# Patient Record
Sex: Male | Born: 1990 | Race: White | Hispanic: No | Marital: Single | Smoking: Current every day smoker
Health system: Southern US, Community
[De-identification: ages and names within clinical notes are randomized; demographics above are authoritative.]

## PROBLEM LIST (undated history)

## (undated) DIAGNOSIS — F1910 Other psychoactive substance abuse, uncomplicated: Secondary | ICD-10-CM

## (undated) DIAGNOSIS — I10 Essential (primary) hypertension: Secondary | ICD-10-CM

## (undated) DIAGNOSIS — F1921 Other psychoactive substance dependence, in remission: Secondary | ICD-10-CM

## (undated) DIAGNOSIS — J45909 Unspecified asthma, uncomplicated: Secondary | ICD-10-CM

## (undated) HISTORY — DX: Other psychoactive substance dependence, in remission: F19.21

## (undated) HISTORY — PX: TONSILLECTOMY: SUR1361

## (undated) HISTORY — DX: Unspecified asthma, uncomplicated: J45.909

## (undated) HISTORY — PX: WISDOM TOOTH EXTRACTION: SHX21

---
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
## 2010-11-29 ENCOUNTER — Emergency Department: Payer: Self-pay | Admitting: Emergency Medicine

## 2011-11-04 ENCOUNTER — Ambulatory Visit: Payer: Self-pay | Admitting: Internal Medicine

## 2011-11-04 LAB — URINALYSIS, COMPLETE
Bilirubin,UR: NEGATIVE
Blood: NEGATIVE
Glucose,UR: NEGATIVE mg/dL (ref 0–75)
Leukocyte Esterase: NEGATIVE
Nitrite: NEGATIVE
Ph: 5 (ref 4.5–8.0)
Protein: NEGATIVE
Squamous Epithelial: NONE SEEN

## 2011-11-04 LAB — CBC WITH DIFFERENTIAL/PLATELET
Basophil #: 0 10*3/uL (ref 0.0–0.1)
Eosinophil #: 0.2 10*3/uL (ref 0.0–0.7)
HGB: 14.4 g/dL (ref 13.0–18.0)
Lymphocyte %: 26.9 %
MCH: 31.5 pg (ref 26.0–34.0)
MCHC: 33.9 g/dL (ref 32.0–36.0)
Neutrophil %: 61.7 %
Platelet: 131 10*3/uL — ABNORMAL LOW (ref 150–440)
RDW: 12.3 % (ref 11.5–14.5)

## 2011-11-04 LAB — COMPREHENSIVE METABOLIC PANEL
Albumin: 4.8 g/dL (ref 3.4–5.0)
Anion Gap: 10 (ref 7–16)
BUN: 9 mg/dL (ref 7–18)
Bilirubin,Total: 0.5 mg/dL (ref 0.2–1.0)
Calcium, Total: 9.4 mg/dL (ref 8.5–10.1)
EGFR (African American): >60
EGFR (Non-African Amer.): >60
Glucose: 77 mg/dL (ref 65–99)
SGOT(AST): 22 U/L (ref 15–37)
Sodium: 146 mmol/L — ABNORMAL HIGH (ref 136–145)
Total Protein: 7.6 g/dL (ref 6.4–8.2)

## 2011-11-04 LAB — ETHANOL: Ethanol %: 0.073 % (ref 0.000–0.080)

## 2011-11-04 LAB — DRUG SCREEN, URINE
Amphetamines, Ur Screen: NEGATIVE (ref ?–1000)
Barbiturates, Ur Screen: NEGATIVE (ref ?–200)
Benzodiazepine, Ur Scrn: POSITIVE (ref ?–200)
Cannabinoid 50 Ng, Ur ~~LOC~~: POSITIVE (ref ?–50)
Cocaine Metabolite,Ur ~~LOC~~: NEGATIVE (ref ?–300)
MDMA (Ecstasy)Ur Screen: NEGATIVE (ref ?–500)

## 2011-11-04 LAB — MAGNESIUM: Magnesium: 2.2 mg/dL

## 2012-10-28 ENCOUNTER — Emergency Department: Payer: Self-pay | Admitting: Internal Medicine

## 2012-10-28 LAB — URINALYSIS, COMPLETE
Bacteria: NONE SEEN
Bilirubin,UR: NEGATIVE
Protein: NEGATIVE
Squamous Epithelial: <1
WBC UR: 1 /HPF (ref 0–5)

## 2012-10-28 LAB — DRUG SCREEN, URINE
Barbiturates, Ur Screen: NEGATIVE (ref ?–200)
Cannabinoid 50 Ng, Ur ~~LOC~~: POSITIVE (ref ?–50)
MDMA (Ecstasy)Ur Screen: NEGATIVE (ref ?–500)
Opiate, Ur Screen: NEGATIVE (ref ?–300)

## 2012-10-28 LAB — CBC
HCT: 45.1 % (ref 40.0–52.0)
MCHC: 33 g/dL (ref 32.0–36.0)
MCV: 93 fL (ref 80–100)
Platelet: 176 10*3/uL (ref 150–440)
RBC: 4.85 10*6/uL (ref 4.40–5.90)
WBC: 8.4 10*3/uL (ref 3.8–10.6)

## 2012-10-28 LAB — COMPREHENSIVE METABOLIC PANEL
Alkaline Phosphatase: 63 U/L (ref 50–136)
BUN: 14 mg/dL (ref 7–18)
Bilirubin,Total: 0.8 mg/dL (ref 0.2–1.0)
Chloride: 104 mmol/L (ref 98–107)
Osmolality: 276 (ref 275–301)
SGPT (ALT): 73 U/L (ref 12–78)

## 2012-10-28 LAB — ETHANOL: Ethanol %: <0.003 % (ref 0.000–0.080)

## 2012-10-28 LAB — TSH: Thyroid Stimulating Horm: 1.08 u[IU]/mL

## 2013-01-30 HISTORY — PX: WISDOM TOOTH EXTRACTION: SHX21

## 2013-06-14 ENCOUNTER — Emergency Department
Admission: EM | Admit: 2013-06-14 | Discharge: 2013-06-14 | Disposition: A | Discharge status: Home / Self Care | Payer: BC Managed Care – PPO | Attending: Sports Medicine" | Admitting: Sports Medicine"

## 2013-06-14 ENCOUNTER — Emergency Department: Payer: Self-pay

## 2013-06-14 DIAGNOSIS — B353 Tinea pedis: Secondary | ICD-10-CM | POA: Insufficient documentation

## 2013-06-14 DIAGNOSIS — J45909 Unspecified asthma, uncomplicated: Secondary | ICD-10-CM | POA: Insufficient documentation

## 2013-06-14 MED ORDER — KETOCONAZOLE 2 % EX CREA
TOPICAL_CREAM | Freq: Two times a day (BID) | CUTANEOUS | Status: DC
Start: 2013-06-14 — End: 2013-11-15

## 2013-06-14 NOTE — ED Notes (Signed)
[facility]--has red,scaling right great toe, has had for 5 month--getting worse.  Now beginning to throb--and hurt to walk on.

## 2013-06-14 NOTE — ED Notes (Signed)
Pt discharged with rx and instructions

## 2013-06-14 NOTE — ED Provider Notes (Signed)
Physician/Midlevel provider first contact with patient: 06/14/13 1413         [facility] EMERGENCY DEPARTMENT History and Physical Exam      Date: 06/14/2013  Patient Name: Jeremiah Hurst,Jeremiah Hurst  Attending Physician: Gareth Morgan,*  Physician Extender: Merryl Hacker PA-C  PCP: Christa See, MD  Patient DOB:  [birth year]  MRN:  [id number]  Room:  E10/ED10-A  History of Presenting Illness     Chief complaint: Toe Pain    HPI/ROS is limited by: none  HPI/ROS given by: patient      Context: Jeremiah Hurst is a 22 y.o. male who presents with toe pain  Location: right great toe Severity: moderate  Duration: states over 5 months  Quality: aching, burning, itching  Associated Signs/ Symptoms: redness and scaling of skin  Exacerbation/Mitigating factors: Patient states he was incarcerated for a time prior to [facility] and now has worsening pain and redness of skin around great toe nail area.  No thickness of nail.      Review of Systems     Review of Systems   Constitutional: Negative.    Musculoskeletal: Positive for myalgias. Negative for joint pain and falls.   Skin: Positive for itching and rash.   Neurological: Negative for tingling, sensory change and focal weakness.   Endo/Heme/Allergies: Does not bruise/bleed easily.          Allergies     Pt  has no known allergies.    Medications     Current Outpatient Rx   Name  Route  Sig  Dispense  Refill   . IBUPROFEN 400 MG PO TABS    Oral    Take 400 mg by mouth every 6 (six) hours as needed.             Marland Kitchen KETOCONAZOLE 2 % EX CREA    Topical    Apply topically 2 (two) times daily.    30 g    0          Past Medical History     Pt has a past medical history of Asthma without status asthmaticus.    Past Surgical History     Pt has past surgical history that includes Wisdom tooth extraction.    Family History     The family history is not on file.    Social History     Pt reports that he has never smoked. He does not have any smokeless tobacco history on  file. He reports that he uses illicit drugs. He reports that he does not drink alcohol.    Physical Exam     CONSTITUTIONAL: Well-developed, well-nourished. Alert, cooperative and in   no acute distress. Vital signs reviewed.   HEAD: Normocephalic, atraumatic.  EYES:  Conjunctiva clear. No discharge or tearing.   RESPIRATORY: Good air movement bilaterally. No wheezing, rales or rhonchi.   No retractions or use of accessory muscles. No respiratory distress.   CARDIAC: RRR. Normal S1 and S2 without murmurs, rubs or gallops.   MS: +  Great toe tenderness. + erythema or STS.  Pulses 2+ and symmetric.    SKIN: Physical Exam   Skin: Rash noted. There is erythema.             No open wounds, no discharge or drainage noted from the area.         NEURO: A&O x 3. Sensory intact throughout.     Orders Placed     No orders of  the defined types were placed in this encounter.           ED Medications Administered     ED Medication Orders     None          Diagnostic Results       The results of the diagnostic studies below have been reviewed by myself:    Labs  Results     ** No Results found for the last 24 hours. **          Radiologic Studies  Radiology Results (24 Hour)     ** No Results found for the last 24 hours. **            MDM / Critical Care     Blood pressure 140/96, pulse 72, temperature 97.6 F (36.4 C), resp. rate 18, height 1.88 m, weight 83.915 kg, SpO2 100.00%.    MDM  Number of Diagnoses or Management Options  Tinea pedis: new, needed workup  Diagnosis management comments: Fungal infection, gout, cellulitis, trauma       Amount and/or Complexity of Data Reviewed  Discuss the patient with other providers: yes    Patient Progress  Patient progress: stable        Procedures         n/a    Diagnosis / Disposition     Clinical Impression  1. Tinea pedis        Disposition  ED Disposition     Discharge [name] discharge to home/self care.    Condition at disposition: Stable            Vital signs were  reviewed at the time of disposition.  Patient Vitals for the past 24 hrs:   BP Temp Pulse Resp SpO2 Height Weight   06/14/13 1355 140/96 mmHg 97.6 F (36.4 C) 72  18  100 % 1.88 m 83.915 kg          Prescriptions  New Prescriptions    KETOCONAZOLE (NIZORAL) 2 % CREAM    Apply topically 2 (two) times daily.                     Merryl Hacker [place], [place]  06/14/13 [phone]

## 2013-06-14 NOTE — Discharge Instructions (Signed)
Athlete'S Foot    Athlete's Foot is caused by a fungal infection in the skin. It affects the skin between the toes where it causes fissures (cracks in the skin). It can also affect the bottom of the foot where it causes dry white scales and peeling of the skin. This infection is more likely to occur when the foot is in hot, sweaty socks and shoes for long periods of time.  This infection is treated with skin creams or oral medicine.  Home Care:   It is important to keep the feet dry. Use absorbent cotton socks and change them if they become sweaty; or wear an open-toe shoe or sandal. Wash the feet at least once a day with soap and water.   Apply the antifungal cream as prescribed. Some antifungal creams are available without a prescription (Lotrimin, Tinactin).   It may take a week before the rash starts to improve and it can take about three to four weeks to completely clear. Continue the medicine until the rash is all gone.   Use over-the-counter antifungal powders or sprays on your feet after exposure to high-risk environments (public showers, gyms and locker rooms) may prevent future infections. You may wish to use appropriate footwear to reduce exposure.  Follow Up  with your doctor as recommended by our staff if the rash is not starting to improve after TEN days of treatment, or if the rash continues to spread.  Get Prompt Medical Attention  if any of the following occur:   Increasing redness or swelling of the foot   Pus draining from cracks in the skin   Fever of 100.4F (38C) or higher, or as directed by your healthcare provider   2000-2014 [employer], [address], [place] [address]. All rights reserved. This information is not intended as a substitute for professional medical care. Always follow your healthcare professional's instructions.

## 2013-06-15 NOTE — ED Provider Notes (Signed)
This patient presented to the Emergency Department today and was initially seen by Physician Assistant Merryl Hacker PA-C.  This patient was seen and examined by me and I agree with the management/treatment and plan.      Gareth Morgan, MD  06/15/13 [phone]

## 2013-11-15 ENCOUNTER — Emergency Department: Payer: BC Managed Care – PPO

## 2013-11-15 ENCOUNTER — Emergency Department
Admission: EM | Admit: 2013-11-15 | Discharge: 2013-11-15 | Disposition: A | Discharge status: Home / Self Care | Payer: BC Managed Care – PPO | Attending: Family Medicine | Admitting: Family Medicine

## 2013-11-15 ENCOUNTER — Emergency Department: Payer: Self-pay

## 2013-11-15 DIAGNOSIS — I880 Nonspecific mesenteric lymphadenitis: Secondary | ICD-10-CM | POA: Insufficient documentation

## 2013-11-15 LAB — COMPREHENSIVE METABOLIC PANEL
ALT: 27 U/L (ref 0–55)
AST (SGOT): 28 U/L (ref 10–42)
Albumin/Globulin Ratio: 1.55 Ratio — ABNORMAL HIGH (ref 0.70–1.50)
Albumin: 4.8 gm/dL (ref 3.5–5.0)
Alkaline Phosphatase: 58 U/L (ref 40–145)
Anion Gap: 17.9 mMol/L (ref 7.0–18.0)
BUN / Creatinine Ratio: 9.7 Ratio — ABNORMAL LOW (ref 10.0–30.0)
BUN: 9 mg/dL (ref 7–22)
Bilirubin, Total: 0.8 mg/dL (ref 0.1–1.2)
CO2: 23.9 mMol/L (ref 20.0–30.0)
Calcium: 10.3 mg/dL (ref 8.5–10.5)
Chloride: 104 mMol/L (ref 98–110)
Creatinine: 0.93 mg/dL (ref 0.80–1.30)
EGFR: >60 mL/min/{1.73_m2}
Globulin: 3.1 gm/dL (ref 2.0–4.0)
Glucose: 100 mg/dL — ABNORMAL HIGH (ref 70–99)
Osmolality Calc: 281 mOsm/kg (ref 275–300)
Potassium: 3.9 mMol/L (ref 3.5–5.3)
Protein, Total: 7.9 gm/dL (ref 6.0–8.3)
Sodium: 142 mMol/L (ref 136–147)

## 2013-11-15 LAB — CBC AND DIFFERENTIAL
Basophils %: 1 % (ref 0.0–3.0)
Basophils Absolute: 0.1 10*3/uL (ref 0.0–0.3)
Eosinophils %: 1.5 % (ref 0.0–7.0)
Eosinophils Absolute: 0.1 10*3/uL (ref 0.0–0.8)
Hematocrit: 48.3 % (ref 39.0–52.5)
Hemoglobin: 16.5 gm/dL (ref 13.0–17.5)
Lymphocytes Absolute: 2 10*3/uL (ref 0.6–5.1)
Lymphocytes: 27.9 % (ref 15.0–46.0)
MCH: 30 pg (ref 28–35)
MCHC: 34 gm/dL (ref 31–36)
MCV: 88 fL (ref 80–100)
MPV: 8.8 fL (ref 6.0–10.0)
Monocytes Absolute: 0.7 10*3/uL (ref 0.1–1.7)
Monocytes: 9.9 % (ref 3.0–15.0)
Neutrophils %: 59.7 % (ref 42.0–78.0)
Neutrophils Absolute: 4.2 10*3/uL (ref 1.7–8.6)
PLT CT: 173 10*3/uL (ref 130–440)
RBC: 5.47 10*6/uL (ref 4.00–5.70)
RDW: 11.5 % (ref 10.5–14.5)
WBC: 7.1 10*3/uL (ref 4.00–11.00)

## 2013-11-15 LAB — URINALYSIS WITH CULTURE REFLEX
Bilirubin, UA: NEGATIVE mg/dL
Blood, UA: NEGATIVE mg/dL
Glucose, UA: NEGATIVE mg/dL
Ketones UA: NEGATIVE mg/dL
Leukocyte Esterase, UA: NEGATIVE Leu/uL
Nitrite, UA: NEGATIVE
Protein, UR: NEGATIVE mg/dL
RBC, UA: NONE SEEN /hpf
Urine Specific Gravity: 1.01 (ref 1.001–1.040)
Urobilinogen, UA: 0.2 mg/dL
WBC, UA: NONE SEEN /hpf
pH, Urine: 8 pH (ref 5.0–8.0)

## 2013-11-15 LAB — LIPASE: Lipase: 12 U/L (ref 8–78)

## 2013-11-15 MED ORDER — IOHEXOL 240 MG/ML IJ SOLN
INTRAMUSCULAR | Status: AC
Start: 2013-11-15 — End: ?
  Filled 2013-11-15: qty 50

## 2013-11-15 MED ORDER — MORPHINE SULFATE (PF) 4 MG/ML IV SOLN
INTRAVENOUS | Status: AC
Start: 2013-11-15 — End: ?
  Filled 2013-11-15: qty 1

## 2013-11-15 MED ORDER — ONDANSETRON HCL 4 MG/2ML IJ SOLN
INTRAMUSCULAR | Status: AC
Start: 2013-11-15 — End: ?
  Filled 2013-11-15: qty 2

## 2013-11-15 MED ORDER — IOHEXOL 350 MG/ML IV SOLN
100.0000 mL | Freq: Once | INTRAVENOUS | Status: AC | PRN
Start: 2013-11-15 — End: 2013-11-15
  Administered 2013-11-15: 100 mL via INTRAVENOUS

## 2013-11-15 MED ORDER — MORPHINE SULFATE 4 MG/ML IJ/IV SOLN (WRAP)
4.0000 mg | Status: DC | PRN
Start: 2013-11-15 — End: 2013-11-15
  Administered 2013-11-15: 4 mg via INTRAVENOUS

## 2013-11-15 MED ORDER — ONDANSETRON HCL 4 MG/2ML IJ SOLN
4.0000 mg | Freq: Once | INTRAMUSCULAR | Status: DC
Start: 2013-11-15 — End: 2013-11-15

## 2013-11-15 MED ORDER — ONDANSETRON HCL 4 MG/2ML IJ SOLN
4.0000 mg | Freq: Once | INTRAMUSCULAR | Status: AC
Start: 2013-11-15 — End: 2013-11-15
  Administered 2013-11-15: 4 mg via INTRAVENOUS

## 2013-11-15 MED ORDER — KETOROLAC TROMETHAMINE 30 MG/ML IJ SOLN
INTRAMUSCULAR | Status: AC
Start: 2013-11-15 — End: ?
  Filled 2013-11-15: qty 1

## 2013-11-15 MED ORDER — KETOROLAC TROMETHAMINE 30 MG/ML IJ SOLN
30.0000 mg | Freq: Once | INTRAMUSCULAR | Status: AC
Start: 2013-11-15 — End: 2013-11-15
  Administered 2013-11-15: 30 mg via INTRAVENOUS

## 2013-11-15 MED ORDER — IOHEXOL 240 MG/ML IJ SOLN
50.0000 mL | Freq: Once | INTRAMUSCULAR | Status: AC
Start: 2013-11-15 — End: 2013-11-15
  Administered 2013-11-15: 50 mL via ORAL

## 2013-11-15 MED ORDER — SODIUM CHLORIDE 0.9 % IV SOLN
INTRAVENOUS | Status: DC
Start: 2013-11-15 — End: 2013-11-15

## 2013-11-15 MED ORDER — IBUPROFEN 600 MG PO TABS
600.0000 mg | ORAL_TABLET | Freq: Four times a day (QID) | ORAL | Status: DC | PRN
Start: 2013-11-15 — End: 2015-06-22

## 2013-11-15 NOTE — ED Notes (Signed)
Pt at ct scan at this time

## 2013-11-15 NOTE — ED Provider Notes (Signed)
Physician/Midlevel provider first contact with patient: 11/15/13 1610         EMERGENCY DEPARTMENT HISTORY AND PHYSICAL EXAM    Date: 11/15/2013  Patient Name: Jeremiah Hurst,Jeremiah Hurst  Attending Physician: Alger Simons, MD  Patient DOB:  1991/01/27  MRN:  [id number]  Room:  E6/ED6-A      History of Presenting Illness     Chief Complaint: RLQ abdominal pain    HPI/ROS is limited by: none  HPI/ROS given by: patient       Context: Jeremiah Hurst is a 23 y.o. male who presents with history of severe RLQ abdominal pain that began 2 days ago. No worse.  Location: RLQ abdominal pain Severity: severe  Duration: 2 days  Quality: aching   Associated Signs/ Symptoms: nausea Exacerbation/Mitigating factors: moving      PMD: Pcp, Noneorunknown, MD    Past Medical History     Past Medical History   Diagnosis Date   . Asthma without status asthmaticus        Past Surgical History     Past Surgical History   Procedure Date   . Wisdom tooth extraction    . Tonsillectomy    . Wisdom tooth extraction june 2014       Family History     Family History   Problem Relation Age of Onset   . Hypertension Father        Social History     History     Social History   . Marital Status: Single     Spouse Name: N/A     Number of Children: N/A   . Years of Education: N/A     Social History Main Topics   . Smoking status: Never Smoker    . Smokeless tobacco: Not on file   . Alcohol Use: No   . 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 Sexually Active: Not on file     Other Topics Concern   . Not on file     Social History Narrative   . No narrative on file       Allergies     No Known Allergies    Home Medications       (Not in a hospital admission)    ED Medications Administered     ED Medication Orders      Start     Status Ordering Provider    11/15/13 1031   ondansetron (ZOFRAN) injection 4 mg   Once in ED      Route: Intravenous  Ordered Dose: 4 mg         Last MAR action:  Given Gerldine Suleiman J II     11/15/13 1030   morphine injection 4 mg   Every 4 hours PRN      Route: Intravenous  Ordered Dose: 4 mg         Last MAR action:  Given Tashya Alberty J II    11/15/13 0935   ketorolac (TORADOL) injection 30 mg   Once in ED      Route: Intravenous  Ordered Dose: 30 mg         Last MAR action:  Given Yuliza Cara J II    11/15/13 0935   ondansetron (ZOFRAN) injection 4 mg   Once in ED      Route: Intravenous  Ordered Dose: 4 mg  Acknowledged Joycie Aerts J II    11/15/13 0934   iohexol (OMNIPAQUE) 240 MG/ML IV/ORAL solution 50 mL   Once in ED      Route: Oral  Ordered Dose: 50 mL         Last MAR action:  Given Minette Brine II    11/15/13 0934   0.9%  NaCl infusion   Continuous      Route: Intravenous         Last MAR action:  New Bag Lenzy Kerschner J II    11/15/13 0931   ondansetron (ZOFRAN) injection 4 mg   Once in ED      Route: Intravenous  Ordered Dose: 4 mg         Last MAR action:  Given Danelly Hassinger J II                  Review of Systems     CONSTITUTIONAL: No fever, chills, fatigue, weight loss or malaise.  EYES: No changes in vision. No eye pain or photophobia. No eye redness, itching, tearing or discharge.  ENMT: No hearing changes, otalgia, otorrhea, rhinorrhea, epistaxis, sore throat, voice changes, drooling, neck pain or stiffness.  CARDIAC: No chest pain, diaphoresis, palpitations, DOE, orthopnea or edema.  RESPIRATORY: No cough, wheezing, stridor or shortness of breath.  GI: + abdominal pain, + nausea, no vomiting, hematemesis, diarrhea, melena, hematochezia or changes in appetite.  GU: No dysuria, hematuria or changes in frequency. No urinary incontinence or retention. No discharge or bleeding. No changes in urinary output.   MS: No back pain, joint pain, joint swelling, joint redness or myalgias.   NEURO: No headache, dizziness, weakness, numbness or LOC. No lethargy, slurred speech or confusion. Normal balance.  INTEGUMENTARY: No skin rash, abrasions or breaks in skin.  HEMATOLOGIC: No  swollen lymph nodes, easy bruising or unexplained bleeding.  PSYCHIATRIC: No suicidal or homicidal thoughts. No auditory or visual   hallucinations.         Physical Exam   Vital signs and nurses notes reviewed.   CONSTITUTIONAL: Well-developed, well-nourished. Alert, cooperative and in   no acute distress. Vital signs reviewed.   HEAD: Normocephalic, atraumatic.  EYES: PERRL. EOM's intact. Conjunctiva clear. No discharge or tearing.   ENT: Normal nares without rhinorrhea. TM's clear bilaterally. Pharynx   visualized without erythema, tonsillar edema or exudates. Uvula midline.  NECK: Non tender. Full ROM without pain. No adenopathy. No JVD.   RESPIRATORY: Good air movement bilaterally. No wheezing, rales or rhonchi.   No retractions or use of accessory muscles. No respiratory distress.   CARDIAC: RRR. Normal S1 and S2 without murmurs, rubs or gallops.   GI: Soft yet tender RLQ. Non-distended, normal bowel sounds, no   organomegaly, + rebound. No CVA tenderness.   MS: No bony tenderness. Full ROM without pain. No edema, erythema or STS. No calf tenderness. Pulses 2+ and symmetric.    SKIN: Warm and dry. No rash or lesions. No abrasions or breaks in skin.  NEURO: A&O x 3. CN II-XII intact. 5/5 strength in bilateral upper and lower   extremities. Sensory intact throughout. Point to point and heel to shin performed without   Difficulty. Romberg and pronator drift negative. Gait steady and balanced.   PSYCH: Normal affect without agitation or paranoia. No apparent suicidal or   homicidal ideation.       Procedures     N/A    Diagnostic Study Results     EKG:  N/A    Monitor: N/A    Laboratory results reviewed by ED provider:  Results     Procedure Component Value Units Date/Time    Comprehensive metabolic panel [[id number]]  (Abnormal) Collected:11/15/13 0945    Specimen Information:Blood / Plasma Updated:11/15/13 1032     Sodium 142 mMol/L      Potassium 3.9 mMol/L      Chloride 104 mMol/L      CO2 23.9 mMol/L       CALCIUM 10.3 mg/dL      Glucose 295 (H) mg/dL      Creatinine 2.84 mg/dL      BUN 9 mg/dL      Protein, Total 7.9 gm/dL      Albumin 4.8 gm/dL      Alkaline Phosphatase 58 U/L      ALT 27 U/L      AST (SGOT) 28 U/L      Bilirubin, Total 0.8 mg/dL      Albumin/Globulin Ratio 1.55 (H) Ratio      Anion Gap 17.9 mMol/L      BUN/Creatinine Ratio 9.7 (L) Ratio      EGFR >60 mL/min/1.11m2      Osmolality Calc 281 mOsm/kg      Globulin 3.1 gm/dL     Lipase [[id number]] Collected:11/15/13 0945    Specimen Information:Blood / Plasma Updated:11/15/13 1032     Lipase 12 U/L     Urinalysis with Culture if Indicated [[id number]]  (Abnormal) Collected:11/15/13 0940    Specimen Information:Urine, Random Updated:11/15/13 1027     Color, UA Yellow      Clarity, UA Clear      Specific Gravity, UR 1.010      pH, Urine 8.0 pH      Protein, UR Negative mg/dL      Glucose, UA Negative mg/dL      Ketones UA Negative mg/dL      Bilirubin, UA Negative mg/dL      Blood, UA Negative mg/dL      Nitrite, UA Negative      Urobilinogen, UA 0.2 mg/dL      Leukocyte Esterase, UA Negative Leu/uL      WBC, UA None Seen /hpf      RBC, UA None Seen /hpf      Bacteria, UA None /hpf      Amorphous, UA Occasional (A) /uL     CBC and differential [[id number]] Collected:11/15/13 0945    Specimen Information:Blood / Blood Updated:11/15/13 1013     WBC 7.1 K/cmm      RBC 5.47 M/cmm      Hemoglobin 16.5 gm/dL      Hematocrit 38.7 %      MCV 88 fL      MCH 30 pg      MCHC 34 gm/dL      RDW 56.4 %      PLT CT 173 K/cmm      MPV 8.8 fL      NEUTROPHIL % 59.7 %      Lymphocytes 27.9 %      Monocytes 9.9 %      Eosinophils % 1.5 %      Basophils % 1.0 %      Neutrophils Absolute 4.2 K/cmm      Lymphocytes Absolute 2.0 K/cmm      Monocytes Absolute 0.7 K/cmm      Eosinophils Absolute 0.1 K/cmm      BASO Absolute 0.1 K/cmm  Radiologic study results reviewed by ED provider:  Radiology Results (24 Hour)     Procedure Component Value Units Date/Time    CT Abdomen  Pelvis W IV And PO Cont [[id number]] Collected:11/15/13 1213    Order Status:Completed  Updated:11/15/13 1222    Narrative:    History: 23 year old male with right lower quadrant abdominal pain.    Comparison: There are no priors currently available to allow for comparison.    Technique: Total axial images are taken from the level of the thyroid down through the proximal thigh with the use of IV  and oral contrast. Images are then reconstructed in the sagittal and coronal planes.    Contrast: Optiray 300 100cc      Findings:      CT abdomen:  The lung bases are within normal limits allowing for some minimal bibasilar atelectasis.   There is no evidence of free air.  The liver is infiltrated with fat..  The gallbladder is within normal limits for the amount of distention.  The spleen is within normal.  The adrenal glands are grossly unremarkable.  The pancreas is within normal limits.  The kidneys opacify with contrast appropriately.  There is no hydronephrosis nor obstructing stone.  The right ureter measures within normal.  The left ureter measures within normal.  The stomach is within normal limits for the amount of distention.  The small bowel measures within normal.  Small bowel mesentery contains a few subcentimeter lymph nodes.  The descending aorta measures within normal limits without evidence of injury.  There is no retroperitoneal lymphadenopathy.    CT pelvis:  The appendix measures within normal limits though seen on axial image 63-68 of series 3.  The large bowel is grossly unremarkable.  There is no free fluid in the pelvis.  The bladder is within normal limits for the amount distention.  The abdominal wall and pelvic wall are grossly unremarkable.  Osseous structures demonstrate degenerative change.           Impression:      1. Hepatic steatosis with hepatomegaly.  2. A few subcentimeter lymph nodes seen in the small bowel mesentery suggestive of mesenteric adenitis. Please correlate  with patient's  recent past medical history.        ReadingStation:[email or web address]      .    Rendering Provider: Alger Simons, MD      VS     Filed Vitals:    11/15/13 1109   BP: 147/85   Pulse: 79   Temp:    Resp: 16   SpO2: 100%         Clinical Course in Emergency Department     Consults: 12:44 Called Dr. Marlene Bast to discuss pt's case with him. Pt will follow up as directed.     Reevaluation: N/A    MDM: Appendicitis, Obstruction, Renal Lith,     Diagnosis and Disposition     Clinical Impression  1. Mesenteric adenitis        Disposition  ED Disposition     Discharge [name] discharge to home/self care.    Condition at disposition: Stable            Vital signs were reviewed at the time of disposition.  Patient Vitals for the past 24 hrs:   BP Temp Pulse Resp SpO2 Height Weight   11/15/13 1109 147/85 mmHg - 79  16  100 % - -   11/15/13 0917 134/92 mmHg 97.5 F (36.4 C)  81  18  100 % 1.905 m 86.183 kg          Prescriptions  New Prescriptions    IBUPROFEN (ADVIL,MOTRIN) 600 MG TABLET    Take 1 tablet (600 mg total) by mouth every 6 (six) hours as needed for Pain.           SIGNED BY: Minette Brine II, MD      This chart was generated by an EMR and may contain errors, including typographical, or omissions not intended by the user.                Alger Simons, MD  11/15/13 [phone]

## 2013-11-15 NOTE — ED Notes (Signed)
Iv started, blood drawn and sent to lab.

## 2013-11-15 NOTE — ED Notes (Signed)
Discharge reviewed with pt and friend.

## 2013-11-15 NOTE — ED Notes (Signed)
Pt up to br, urine sent to lab

## 2013-11-15 NOTE — ED Notes (Signed)
See quick triage

## 2013-11-15 NOTE — ED Notes (Signed)
Pt with c/o pain at level 8-10. States more pain than earlier. Discussed use of morphine with current rehab from narcs-pt with increase pain since here. Accepted morphine for pain control

## 2013-11-15 NOTE — ED Notes (Signed)
Pt completed po contrast. Radiology made aware.

## 2013-11-15 NOTE — Discharge Instructions (Signed)
Adenitis, Mesenteric   The mesentery is a sheet of tissue that attaches the intestines to the abdominal wall. Lymph nodes are small glands throughout the body. They are part of the system that fights infection. Mesenteric adenitis is swelling of the lymph nodes in the mesentery. The problem is caused by an infection, often of the intestines. Symptoms include severe pain in the abdomen, often on the lower right side. They may also include fever, throwing up, or diarrhea. The problem most often goes away on its own in a few days. If a bacterial infection is present, it may be treated with antibiotics. Medications may also be given to help relieve pain until the problem subsides.    Home Care  Medications: The doctor may prescribe medications for pain, nausea, or infection. Follow the doctor's instructions when using these medications. If you are given medication for infection, take all of it as directed until it is gone, even if you feel better.  General Care:    Rest until you feel better.   To help relieve abdominal pain, soak a towel in warm water and place it on your belly.   If you have had diarrhea or vomiting, follow the guidelines you are given for what to eat and drink and what to avoid.   Drink plenty of fluids.   Don't smoke or drink alcohol.  Follow Up  as advised by the doctor or our staff. It is often very hard to tell mesenteric adenitis apart from appendicitis. So close follow-up is needed.  Get Prompt Medical Attention  if any of the following occurs:   Fever of 100.4F (38C) or higher   Pain not relieved with medications, or pain that goes away and returns   Pain that is getting worse over time or changing in location   Severe diarrhea or vomiting   Severe headache   Few or no stools or gas   Little or no urine   Leg or foot cramps   Small dark red dots on the skin   Swelling in the abdomen   Bloody stools   2000-2014 [employer], [address], [place] [address].  All rights reserved. This information is not intended as a substitute for professional medical care. Always follow your healthcare professional's instructions.

## 2013-11-15 NOTE — ED Notes (Signed)
MD at bedside. 

## 2015-06-22 ENCOUNTER — Emergency Department: Payer: BC Managed Care – PPO

## 2015-06-22 ENCOUNTER — Emergency Department
Admission: EM | Admit: 2015-06-22 | Discharge: 2015-06-22 | Disposition: A | Discharge status: Home / Self Care | Payer: BC Managed Care – PPO | Attending: Emergency Medicine | Admitting: Emergency Medicine

## 2015-06-22 DIAGNOSIS — L03114 Cellulitis of left upper limb: Secondary | ICD-10-CM | POA: Insufficient documentation

## 2015-06-22 MED ORDER — CEPHALEXIN 500 MG PO CAPS
500.0000 mg | ORAL_CAPSULE | Freq: Three times a day (TID) | ORAL | Status: AC
Start: 2015-06-22 — End: 2015-07-02

## 2015-06-22 MED ORDER — TETANUS-DIPHTH-ACELL PERTUSSIS 5-2.5-18.5 LF-MCG/0.5 IM SUSP
INTRAMUSCULAR | Status: AC
Start: 2015-06-22 — End: ?
  Filled 2015-06-22: qty 0.5

## 2015-06-22 MED ORDER — TETANUS-DIPHTH-ACELL PERTUSSIS 5-2.5-18.5 LF-MCG/0.5 IM SUSP
0.5000 mL | INTRAMUSCULAR | Status: AC | PRN
Start: 2015-06-22 — End: 2015-06-22
  Administered 2015-06-22: 0.5 mL via INTRAMUSCULAR

## 2015-06-22 MED ORDER — SULFAMETHOXAZOLE-TRIMETHOPRIM 800-160 MG PO TABS
1.0000 | ORAL_TABLET | Freq: Two times a day (BID) | ORAL | Status: AC
Start: 2015-06-22 — End: 2015-07-02

## 2015-06-22 NOTE — ED Provider Notes (Signed)
[facility] EMERGENCY DEPARTMENT History and Physical Exam      Patient Name: Jeremiah Hurst, Jeremiah Hurst  Encounter Date:  06/22/2015  Attending Physician: Justice Britain, *  Nurse Practitioner: Angus Seller, NP  PCP: Christa See, MD  Patient DOB:  1990-10-19  MRN:  [id number]  Room:  E7/ED7-A      History of Presenting Illness     Chief complaint: Left elbow infection    HPI/ROS is limited by: none  HPI/ROS given by: patient    Location: left elbow  Duration: 2 days  Severity: moderate  Quality: 5/10  Timing: constant  Modifying Factors: none  Associated Symptoms: none      Jeremiah Hurst is a 24 y.o. male who presents with left elbow pain. He states a few days ago he had a "wart" near his elbow so he cut it off on his own with utility knife. He states it has not become red, hot, and painful to move. He went to urgent care, they gave him Rx for Bactrim and Keflex earlier, but told him he may need an x-ray and should go to the ER.       Review of Systems   Review of Systems   Constitutional: Negative for fever and chills.   Respiratory: Negative for cough.    Cardiovascular: Negative for chest pain.   Gastrointestinal: Negative for nausea, vomiting, abdominal pain and diarrhea.   Musculoskeletal: Positive for joint pain. Negative for falls.   Skin: Negative for rash.        Left elbow/forearm erythema and swelling   Neurological: Negative for dizziness.        Allergies     Pt has No Known Allergies.    Medications     Current Outpatient Rx   Name  Route  Sig  Dispense  Refill   . DiphenhydrAMINE HCl (BENADRYL ALLERGY PO)    Oral    Take 2 tablets by mouth as needed.             Marland Kitchen ibuprofen (ADVIL,MOTRIN) 400 MG tablet    Oral    Take 200 mg by mouth every 6 (six) hours as needed.                . cephALEXin (KEFLEX) 500 MG capsule    Oral    Take 1 capsule (500 mg total) by mouth 3 (three) times daily.    30 capsule    0     . sulfamethoxazole-trimethoprim (BACTRIM DS,SEPTRA DS) 800-160 MG per tablet    Oral     Take 1 tablet by mouth 2 (two) times daily.    20 tablet    0          Past Medical History     Pt has a past medical history of Asthma without status asthmaticus.    Past Surgical History     Pt has past surgical history that includes Wisdom tooth extraction; Tonsillectomy; and Wisdom tooth extraction (june 2014).    Family History     The family history includes Hypertension in his father.    Social History     Pt reports that he has been smoking Cigarettes.  He has been smoking about 0.50 packs per day. He does not have any smokeless tobacco history on file. He reports that he does not drink alcohol or use illicit drugs.    Physical Exam     Blood pressure 122/74, pulse 68, temperature 98 F (36.7 C), temperature source Oral,  resp. rate 16, height 1.88 m, weight 83.915 kg, SpO2 100 %.    Physical Exam   Constitutional: He appears well-developed and well-nourished.   HENT:   Head: Normocephalic.   Eyes: Pupils are equal, round, and reactive to light.   Cardiovascular: Normal rate.    Pulmonary/Chest: Effort normal.   Musculoskeletal:        Left elbow: He exhibits swelling. He exhibits normal range of motion, no effusion, no deformity and no laceration.        Arms:  Neurological: He is alert.   Skin: Skin is warm and dry.   Psychiatric: He has a normal mood and affect.   Nursing note and vitals reviewed.       Orders Placed     Orders Placed This Encounter   Procedures   . XR Elbow Left AP Lateral And Obliques       Diagnostic Results       The results of the diagnostic studies below have been reviewed by myself:    Labs  Results     ** No results found for the last 24 hours. **          Radiologic Studies  Radiology Results (24 Hour)     Procedure Component Value Units Date/Time    XR Elbow Left AP Lateral And Obliques [[id number]] Collected:  06/22/15 1420    Order Status:  Completed Updated:  06/22/15 1423    Narrative:      Clinical History:  Reason For Exam:  pain, cut off "lesion" on his  own    Examination:  AP, lateral and oblique views of the left elbow.    Comparison:  None available.    Findings:  There is no evidence of fracture or malalignment. The joint spaces appear maintained. There is no appreciable joint  effusion. Mild dorsal soft tissue swelling is suspected.      Impression:      Mild dorsal soft tissue swelling is suspected. No acute osseous pathology is seen.    ReadingStation:[id number]          EKG: none    Procedures     ED Course     Blood pressure 122/74, pulse 68, temperature 98 F (36.7 C), temperature source Oral, resp. rate 16, height 1.88 m, weight 83.915 kg, SpO2 100 %.    I reviewed the vital signs, nursing notes, past medical history, past surgical history, family history and social history.   I have reviewed the patient's previous charts.     O2 sat- saturation: 100 ; Oxygen use: RA Interpretation: Normal    Discussed the need to return to the ED in 48 hours if antibiotics have not improved area. He did not want anything for pain at this time.     Patient verbalized understanding and was agreeable to plan.     I discussed this case with the attending physician in the emergency department and they agree with the assessment and treatment plan.     Medications Given in ED:  E7-ED7-A - MAR ACTION REPORT  (last 24 hrs)         BERRY, Anshi Jalloh, RN       Medication Name Action Time Action Site Route Rate Dose Reason Comments User     tetanus-diphth-acell pertussis (BOOSTRIX) injection 0.5 mL 06/22/15 1448 Given Right Deltoid Intramuscular  0.5 mL   Audree Bane, RN                Prescriptions:  Discharge Medication List as of 06/22/2015  2:31 PM      START taking these medications    Details   cephALEXin (KEFLEX) 500 MG capsule Take 1 capsule (500 mg total) by mouth 3 (three) times daily., Starting 06/22/2015, Until Mon 07/02/15, Normal      sulfamethoxazole-trimethoprim (BACTRIM DS,SEPTRA DS) 800-160 MG per tablet Take 1 tablet by mouth 2 (two) times daily., Starting  06/22/2015, Until Mon 07/02/15, Normal               Follow-up Information     Follow up with [facility] Emergency Department In 2 days.    Specialty:  Emergency Medicine    Why:  If symptoms worsen, For wound re-check    Contact information:    [address]  [place] [address]  [phone]            MDM / Critical Care     This patient presents with a soft tissue infection that appears to be appropriate for outpatient treatment with close follow-up.  A serious, rapidly progressive infectious process, bone infection, abscess or necrotizing fasciitis is unlikely based upon the patient's presentation.  There is no extensive cellulitic area or lymphangitic spread or signs of sepsis.  Antibiotic treatment has been initiated here and response to treatment will be based on reassessment at close follow-up.  The patient has been instructed about signs and symptoms of acute progression of infection and to return immediately if any of these occur. Cellulitis and soft tissue infection warnings were given.  Patient was well-appearing on serial reevaluation at time of disposition.  Diagnostic impression and plan were discussed with the patient and/or family.  If performed results of lab/radiology tests were discussed with the patient and/or family. All questions were answered and concerns addressed.       Diagnosis / Disposition     Clinical Impression  1. Cellulitis of left upper extremity        Disposition  ED Disposition     Discharge [name] discharge to home/self care.    Condition at disposition: Stable            Angus Seller, NP  1:59 PM      This chart was generated by an EMR and may contain errors, including typographical, or omissions not intended by the user.         Angus Seller, NP  06/23/15 1116

## 2015-06-22 NOTE — ED Notes (Signed)
Pt informed this RN that his talked to his parents and was told that his tetanus shot was not up to date. This RN notified NP Calvert Cantor who gave verbal order for tetanus shot for pt.

## 2015-06-22 NOTE — ED Notes (Addendum)
Pt instructed to wait in room for 15 minutes after tetanus shot. Pt states pain has improved rates pain 6/10. Pt denies needs at this time. Discharge instructions, follow to the ED in 2 days and prescriptions reviewed with pt. Pt states understanding.

## 2015-06-22 NOTE — ED Notes (Signed)
Pt resting in bed quietly. Call bell in reach. Friend at bedside.

## 2015-06-22 NOTE — ED Notes (Signed)
Pt c/o redness and swelling ib left elbow that started Tuesday. Pt states that he cut off a wart on left elbow Monday. PT denies fever. Pt states pain is sharp and has some numbness in left hand.

## 2015-06-22 NOTE — Discharge Instructions (Signed)
Discharge Instructions for Cellulitis  You have been diagnosed with cellulitis. This is an infection in the deepest layer of the skin. In some cases, the infection also affects the muscle. Cellulitis is caused by bacteria. The bacteria canenter the body through broken skin. This can happen with a cut, scratch, animal bite, or an insect bite that has been scratched. You may have been treated in the hospital with antibiotics and fluids. You will likely be given a prescription for antibiotics to take at home. This sheet will help you take care of yourself at home.  Home Care  When you are home:   Take the prescribed antibiotic medication you are given as directed until it is gone. Take it even if you feel better. It treats the infection and stops it from returning. Not taking all of the medication can make future infections hard to treat.   Keep the infected area clean.   When possible, raise the infected area above the level of your heart. This helps keep swelling down.   Talk to your doctor if you are in pain. Ask what kind of over-the-counter medication you can take for pain.   Apply clean bandages as advised.   Take your temperature once a day for a week.   Wash your hands often to prevent spreading the infection.  In the future, wash your hands before and after you touch cuts, scratches, or bandages. This will help prevent infection.     [address] [employer], [place] [address]. All rights reserved. This information is not intended as a substitute for professional medical care. Always follow your healthcare professional's instructions.          Cellulitis  You have an infection of the skin known as cellulitis. This usually starts with a scrape, cut, insect bite, blister or other opening in the skin which becomes infected. This is a serious condition. It must be watched closely to be sure the infection is not spreading.  With antibiotic treatment, the size of the  red area will gradually shrink in size until the skin returns to normal. This will take 7-10 days.  The red area should never increase in size once the antibiotic medicine has been started. Occasionally, an infection will be resistant to one antibiotic and another one will have to be used.  Home Care:  1) Limit the use of the affected part, since excess movement can cause the infection to spread.  2) If the infection is on your leg, walk as little as possible during the first few days of the treatment. Keep your leg elevated while sitting. This will reduce swelling.  3) Take all of the antibiotic medicine exactly as directed until it is gone. Be careful not to miss any doses, especially during the first seven days.  Follow Up  with your doctor or this facility as directed. Check the infected area daily for the warning signs listed below.  Get Prompt Medical Attention  if any of the following occur:  -- Spreading area of redness  -- Increasing swelling or pain  -- Appearance of pus or drainage  -- Fever over 100.4 F (38.0 C) oral, or over 101.4 F (38.6 C) rectal, after two days on antibiotics   2000-2015 [employer] [address] [place] [address]. All rights reserved. This information is not intended as a substitute for professional medical care. Always follow your healthcare professional's instructions.  [facility] PHYSICIAN PRACTICES       Practice Name        Phone     Location       [facility]       [phone] [address] [place]  [address]         [facility]         [phone] [address]    [place], [place]  [address]           [facility]         [phone] [address],    [place], [place] [address]           [facility]       [phone] or Toll      Free: [phone]     [phone] [address]    [place], [place]   [address]       [facility]         [phone] [address],    [place], [place] [address]         [facility]       [phone] [address],    [place], [place] [address]         [facility]       [phone] S[address]    [place], [place]  [address]       [facility]       [phone] [address],    [place], [place] [address]         [facility]       [phone] [address],    [place], [place] [address]         [facility]       [phone] [address],    [place], [place] [address]         [facility]       [phone]     [address]    [place], [place]  [address]           [facility]       [phone]     [address]    [place], [place]  [address]          [facility] at       [facility]       [phone]     [address]          [address]    [place], [place]  [address]       [facility]       [phone]     [address]    [place], [place]  [address]         [facility]       [phone]     [address]    [address] [place], [place]  [address]

## 2015-06-22 NOTE — ED Notes (Signed)
No reaction noted. Pt ambulated out to lobby with steady gait.

## 2015-06-22 NOTE — ED Notes (Signed)
Tdap (Tetanus, Diphtheria, Pertussis) VIS  Format:  Select one   PDF [161 KB]   RTF [126 KB]  Recommend on [employer]   Facebook   LinkedIn   Email   Digg   Add this to your site     On this Page   Why get vaccinated?   Tdap vaccine   Some people should not get this vaccine   Risks   What if there is a serious problem?   The National Vaccine Injury Compensation Program   How can I learn more?  Current Edition Date: 10/25/2013   Print VIS[2 pages](http://www.cdc.gov/vaccines/hcp/vis/vis-statements/tdap.pdf)   Do you still see the "old" edition?(http://www.cdc.gov/vaccines/hcp/vis/refresh-browser.html)   RTF file[4 pages](http://www.cdc.gov/vaccines/hcp/vis/vis-statements/tdap.rtf)  (For use in electronic systems)   Provider Information: Tdap VIS(http://www.cdc.gov/vaccines/hcp/vis/vis-statements/tdap-hcp-info.html)   See note about this VIS(http://www.cdc.gov/vaccines/hcp/vis/what-is-new.html#td-tdap-mar)   VIS in other languages  (RoboDrop.co.nz)   Tdap Vaccine  What You Need to Know  Why get vaccinated?  Tetanus, diphtheria, and pertussis are very serious diseases. Tdap vaccine can protect [place] from these diseases. And, Tdap vaccine given to pregnant women can protect newborn babies against pertussis.  TETANUS (Lockjaw) is rare in the [place] today. It causes painful muscle tightening and stiffness, usually all over the body.   It can lead to tightening of muscles in the head and neck so you can't open your mouth, swallow, or sometimes even breathe. Tetanus kills about 1 out of 10 people who are infected even after receiving the best medical care.  DIPHTHERIA is also rare in the [place] today. It can cause a thick coating to form in the back of the throat.   It can lead to breathing problems, heart failure, paralysis, and death.  PERTUSSIS (Whooping Cough) causes severe coughing spells, which can cause difficulty  breathing, vomiting, and disturbed sleep.   It can also lead to weight loss, incontinence, and rib fractures. Up to 2 in 100 adolescents and 5 in 100 adults with pertussis are hospitalized or have complications, which could include pneumonia or death.  These diseases are caused by bacteria. Diphtheria and pertussis are spread from person to person through secretions from coughing or sneezing. Tetanus enters the body through cuts, scratches, or wounds.  Before vaccines, as many as 200,000 cases of diphtheria, 200,000 cases of pertussis, and hundreds of cases of tetanus, were reported in the [place] each year. Since vaccination began, reports of cases for tetanus and diphtheria have dropped by about 99% and for pertussis by about 80%.  Top of Page  Tdap vaccine  Tdap vaccine can protect adolescents and adults from tetanus, diphtheria, and pertussis. One dose of Tdap is routinely given at age 37 or 23. People who did not get Tdap at that age should get it as soon as possible.  Tdap is especially important for health care professionals and anyone having close contact with a baby younger than 12 months.  Pregnant women should get a dose of Tdap during every pregnancy, to protect the newborn from pertussis. Infants are most at risk for severe, life-threatening complications from pertussis.  Another vaccine, called Td, protects against tetanus and diphtheria, but not pertussis. A Td booster should be given every 10 years. Tdap may be given as one of these boosters if you have never gotten Tdap before. Tdap may also be given after a severe cut or burn to prevent tetanus infection.  Your doctor or the person giving you the vaccine can give you more information.  Tdap may safely be given at the same time as other vaccines.  Top of Page  Some people should not get this vaccine   A person who has ever had a life-threatening allergic reaction after a previous dose of any diphtheria, tetanus or pertussis containing  vaccine, OR has a severe allergy to any part of this vaccine, should not get Tdap vaccine. Tell the person giving the vaccine about any severe allergies.   Anyone who had coma or long repeated seizures within 7 days after a childhood dose of DTP or DTaP, or a previous dose of Tdap, should not get Tdap, unless a cause other than the vaccine was found. They can still get Td.   Talk to your doctor if you:   have seizures or another nervous system problem,   had severe pain or swelling after any vaccine containing diphtheria, tetanus or pertussis,   ever had a condition called Guillain Barr Syndrome (GBS),   aren't feeling well on the day the shot is scheduled.  Top of Page  Risks  With any medicine, including vaccines, there is a chance of side effects. These are usually mild and go away on their own. Serious reactions are also possible but are rare.  Most people who get Tdap vaccine do not have any problems with it.  Mild problems following Tdap:  (Did not interfere with activities)   Pain where the shot was given (about 3 in 4 adolescents or 2 in 3 adults)   Redness or swelling where the shot was given (about 1 person in 5)   Mild fever of at least 100.27F (up to about 1 in 25 adolescents or 1 in 100 adults)   Headache (about 3 or 4 people in 10)   Tiredness (about 1 person in 3 or 4)   Nausea, vomiting, diarrhea, stomach ache (up to 1 in 4 adolescents or 1 in 10 adults)   Chills,sore joints (about 1 person in 10)   Body aches (about 1 person in 3 or 4)   Rash, swollen glands (uncommon)  Moderate problems following Tdap:  (Interfered with activities, but did not require medical attention)   Pain where the shot was given (up to 1 in 5 or 6)   Redness or swelling where the shot was given (up to about 1 in 16 adolescents or 1 in 12 adults)   Fever over 102F (about 1 in 100 adolescents or 1 in 250 adults)   Headache (about 1 in 7 adolescents or 1 in 10 adults)   Nausea, vomiting, diarrhea, stomach  ache (up to 1 or 3 people in 100)   Swelling of the entire arm where the shot was given (up to about 1 in 500).  Severe problems following Tdap:  (Unable to perform usual activities; required medical attention)   Swelling, severe pain, bleeding, and redness in the arm where the shot was given (rare).  Problems that could happen after any vaccine:   People sometimes faint after a medical procedure, including vaccination. Sitting or lying down for about 15 minutes can help prevent fainting, and injuries caused by a fall. Tell your doctor if you feel dizzy, or have vision changes or ringing in the ears.   Some people get severe pain in the shoulder and have difficulty moving the arm where a shot was given. This happens very rarely.   Any medication can cause a severe allergic reaction. Such reactions from a vaccine are very rare, estimated at fewer than 1  in a million doses, and would happen within a few minutes to a few hours after the vaccination.  As with any medicine, there is a very remote chance of a vaccine causing a serious injury or death.  The safety of vaccines is always being monitored. For more information, visit the Vaccine Safety site.  Top of Page  What if there is a serious problem?  What should I look for?   Look for anything that concerns you, such as signs of a severe allergic reaction, very high fever, or unusual behavior.   Signs of a severe allergic reaction can include hives, swelling of the face and throat, difficulty breathing, a fast heartbeat, dizziness, and weakness. These would usually start a few minutes to a few hours after the vaccination.  What should I do?   If you think it is a severe allergic reaction or other emergency that can't wait, call 9-1-1 or get the person to the nearest hospital. Otherwise, call your doctor.   Afterward, the reaction should be reported to the Vaccine Adverse Event Reporting System (VAERS). Your doctor might file this report, or you can do it  yourself through the VAERS website, or by calling [phone].  VAERS does not give medical advice.  Top of Page  [employer] Vaccine Injury [employer]  The [employer] Vaccine Injury Compensation Program (VICP) is a federal program that was created to compensate people who may have been injured by certain vaccines.  Persons who believe they may have been injured by a vaccine can learn about the program and about filing a claim by calling [phone] or visiting the [facility] website. There is a time limit to file a claim for compensation.  Top of Page  How can I learn more?   Ask your doctor. He or she can give you the vaccine package insert or suggest other sources of information.   Call your local or state health department(http://www.cdc.gov/vaccines/imz-managers/awardee-imz-websites.html).   Contact the Centers for Disease Control and Prevention (CDC):   Call 864-198-3225 (1-800-CDC-INFO)   Visit CDC's vaccines website(http://www.cdc.gov/vaccines/default.htm)    Many Vaccine Information Statements are available in espaol and other languages. See PurchaseFilters.at.  Hojas de informacin sobre vacunas estn disponibles en espaol y en muchos otros idiomas. Visite http://www.hart-duffy.com/.asp  Vaccine Information Statement  Tdap Vaccine (10/25/2013)  [place]  981XB-14  Office Use Only

## 2017-06-19 DIAGNOSIS — G894 Chronic pain syndrome: Secondary | ICD-10-CM | POA: Insufficient documentation

## 2017-06-19 DIAGNOSIS — F1121 Opioid dependence, in remission: Secondary | ICD-10-CM | POA: Insufficient documentation

## 2019-01-11 ENCOUNTER — Other Ambulatory Visit: Payer: Self-pay

## 2019-01-11 ENCOUNTER — Ambulatory Visit (INDEPENDENT_AMBULATORY_CARE_PROVIDER_SITE_OTHER): Payer: Self-pay | Admitting: Family Medicine

## 2019-01-11 ENCOUNTER — Encounter: Payer: Self-pay | Admitting: Family Medicine

## 2019-01-11 VITALS — BP 120/70 | HR 72 | Ht 74.0 in | Wt 231.0 lb

## 2019-01-11 DIAGNOSIS — Z1211 Encounter for screening for malignant neoplasm of colon: Secondary | ICD-10-CM

## 2019-01-11 DIAGNOSIS — Z0000 Encounter for general adult medical examination without abnormal findings: Secondary | ICD-10-CM

## 2019-01-11 DIAGNOSIS — I10 Essential (primary) hypertension: Secondary | ICD-10-CM

## 2019-01-11 LAB — HEMOCCULT GUIAC POC 1CARD (OFFICE): Fecal Occult Blood, POC: NEGATIVE

## 2019-01-11 NOTE — Patient Instructions (Signed)

## 2019-01-11 NOTE — Progress Notes (Signed)
Date:  01/11/2019   Name:  Christian Doyle   DOB:  1991-05-03   MRN:  [id number]   Chief Complaint: Establish Care (no issues, no complaints)  Patient is a 28 year old male who presents for an establishment care exam. The patient reports the following problems: feet swelling. Health maintenance has been reviewed needs tetanus.   Review of Systems  Constitutional: Negative for chills and fever.  HENT: Negative for congestion, dental problem, drooling, ear discharge, ear pain and sore throat.   Eyes: Negative for photophobia, pain and redness.  Respiratory: Negative for cough, choking, chest tightness, shortness of breath, wheezing and stridor.   Cardiovascular: Negative for chest pain, palpitations and leg swelling.  Gastrointestinal: Negative for abdominal pain, blood in stool, constipation, diarrhea and nausea.  Endocrine: Negative for polydipsia.  Genitourinary: Negative for dysuria, frequency, hematuria and urgency.  Musculoskeletal: Negative for back pain, myalgias and neck pain.  Skin: Negative for rash.  Allergic/Immunologic: Negative for environmental allergies.  Neurological: Negative for dizziness, seizures and headaches.  Hematological: Does not bruise/bleed easily.  Psychiatric/Behavioral: Negative for suicidal ideas. The patient is not nervous/anxious.     Patient Active Problem List   Diagnosis Date Noted  . Chronic pain syndrome 06/19/2017  . Opioid dependence in remission (HCC) 06/19/2017    Not on File    Social History   Tobacco Use  . Smoking status: Current Every Day Smoker    Types: Cigarettes  . Smokeless tobacco: Former Engineer, waterUser  Substance Use Topics  . Alcohol use: Never    Frequency: Never  . Drug use: Yes    Types: Marijuana     Medication list has been reviewed and updated.  Current Meds  Medication Sig  . furosemide (LASIX) 20 MG tablet Take 1 tablet by mouth daily.  Marland Kitchen. lisinopril-hydrochlorothiazide (ZESTORETIC) 10-12.5 MG tablet Take  1 tablet by mouth daily.  . methadone (DOLOPHINE) 10 MG/ML solution 100 mg per day  . potassium chloride (K-DUR) 10 MEQ tablet Take 1 tablet by mouth daily.    No flowsheet data found.  BP Readings from Last 3 Encounters:  01/11/19 120/70    Physical Exam Vitals signs and nursing note reviewed.  Constitutional:      Appearance: Normal appearance. He is overweight.  HENT:     Head: Normocephalic. No left periorbital erythema.     Jaw: There is normal jaw occlusion.     Right Ear: Hearing, tympanic membrane, ear canal and external ear normal.     Left Ear: Hearing, tympanic membrane, ear canal and external ear normal.     Nose: Nose normal. No nasal deformity.     Right Nostril: No foreign body.     Left Nostril: No foreign body.     Mouth/Throat:     Lips: Pink.     Mouth: Mucous membranes are moist.     Dentition: Normal dentition.     Tongue: No lesions. Tongue does not deviate from midline.     Pharynx: Oropharynx is clear. Uvula midline.     Tonsils: No tonsillar exudate or tonsillar abscesses.  Eyes:     General: Lids are normal. Vision grossly intact. Gaze aligned appropriately. No scleral icterus.       Right eye: No discharge.        Left eye: No discharge.     Conjunctiva/sclera: Conjunctivae normal.     Pupils: Pupils are equal, round, and reactive to light.     Funduscopic exam:  Right eye: No hemorrhage, AV nicking, arteriolar narrowing or papilledema.        Left eye: No hemorrhage, AV nicking, arteriolar narrowing or papilledema.  Neck:     Musculoskeletal: Full passive range of motion without pain, normal range of motion and neck supple.     Thyroid: No thyroid mass, thyromegaly or thyroid tenderness.     Vascular: No carotid bruit, hepatojugular reflux or JVD.     Trachea: Trachea and phonation normal. No tracheal deviation.  Cardiovascular:     Rate and Rhythm: Normal rate and regular rhythm.     Chest Wall: PMI is not displaced. No thrill.     Pulses:  Normal pulses. No decreased pulses.          Dorsalis pedis pulses are 2+ on the right side and 2+ on the left side.       Posterior tibial pulses are 2+ on the right side and 2+ on the left side.     Heart sounds: Normal heart sounds, S1 normal and S2 normal. No murmur. No systolic murmur. No diastolic murmur. No friction rub. No gallop. No S3 or S4 sounds.   Pulmonary:     Effort: Pulmonary effort is normal. No respiratory distress.     Breath sounds: Normal breath sounds and air entry. No decreased breath sounds, wheezing, rhonchi or rales.  Chest:     Breasts: Breasts are symmetrical.        Right: Normal.        Left: Normal.  Abdominal:     General: Bowel sounds are normal. There is no distension.     Palpations: Abdomen is soft. There is no hepatomegaly, splenomegaly or mass.     Tenderness: There is no abdominal tenderness. There is no guarding or rebound.     Hernia: There is no hernia in the umbilical area or ventral area.  Genitourinary:    Penis: Normal.      Scrotum/Testes: Normal.     Epididymis:     Right: Normal.     Left: Normal.  Musculoskeletal: Normal range of motion.        General: No tenderness.     Right lower leg: 1+ Pitting Edema present.     Left lower leg: 1+ Pitting Edema present.  Feet:     Right foot:     Skin integrity: Skin integrity normal.     Left foot:     Skin integrity: Skin integrity normal.  Lymphadenopathy:     Head:     Right side of head: No submental, submandibular or tonsillar adenopathy.     Left side of head: No submental, submandibular or tonsillar adenopathy.     Cervical: No cervical adenopathy.     Right cervical: No superficial, deep or posterior cervical adenopathy.    Left cervical: No superficial, deep or posterior cervical adenopathy.  Skin:    General: Skin is warm.     Capillary Refill: Capillary refill takes less than 2 seconds.     Findings: No rash.  Neurological:     General: No focal deficit present.      Mental Status: He is alert and oriented to person, place, and time.     Cranial Nerves: Cranial nerves are intact. No cranial nerve deficit.     Sensory: Sensation is intact.     Motor: Motor function is intact.     Coordination: Coordination is intact.     Deep Tendon Reflexes: Reflexes are normal and symmetric.  Reflex Scores:      Tricep reflexes are 2+ on the right side and 2+ on the left side.      Bicep reflexes are 2+ on the right side and 2+ on the left side.      Brachioradialis reflexes are 2+ on the right side and 2+ on the left side.      Patellar reflexes are 2+ on the right side and 2+ on the left side.      Achilles reflexes are 2+ on the right side and 2+ on the left side. Psychiatric:        Behavior: Behavior is cooperative.     Wt Readings from Last 3 Encounters:  01/11/19 231 lb (104.8 kg)    BP 120/70   Pulse 72   Ht 6\' 2"  (1.88 m)   Wt 231 lb (104.8 kg)   BMI 29.66 kg/m   Assessment and Plan: 1. Annual physical exam No subjective/objective concerns noted reviewing history and during physical exam.  Review of previous exams, labs, x-rays, specialty visits unremarkable.  Will review labs when they are done at his other clinic visit. 2.  Essential hypertension- continue lisinopril hydrochlorothiazide as well as Lasix for occasional swelling although I think the edema to his feet is secondary to venous insufficiency 3.  Venous insufficiency patient has been instructed to elevate feet on a as needed basis and to avoid long sitting episodes such as sleeping in a chair position.

## 2019-01-11 NOTE — Addendum Note (Signed)
Addended by: Everitt Amber on: 01/11/2019 04:35 PM   Modules accepted: Orders

## 2019-09-26 ENCOUNTER — Ambulatory Visit
Admission: EM | Admit: 2019-09-26 | Discharge: 2019-09-26 | Disposition: A | Discharge status: Home / Self Care | Payer: HRSA Program | Attending: Family Medicine | Admitting: Family Medicine

## 2019-09-26 ENCOUNTER — Other Ambulatory Visit: Payer: Self-pay

## 2019-09-26 ENCOUNTER — Telehealth: Payer: Self-pay | Admitting: Family Medicine

## 2019-09-26 DIAGNOSIS — A084 Viral intestinal infection, unspecified: Secondary | ICD-10-CM

## 2019-09-26 DIAGNOSIS — I10 Essential (primary) hypertension: Secondary | ICD-10-CM | POA: Insufficient documentation

## 2019-09-26 DIAGNOSIS — F17210 Nicotine dependence, cigarettes, uncomplicated: Secondary | ICD-10-CM | POA: Insufficient documentation

## 2019-09-26 DIAGNOSIS — Z79899 Other long term (current) drug therapy: Secondary | ICD-10-CM | POA: Diagnosis not present

## 2019-09-26 DIAGNOSIS — Z20822 Contact with and (suspected) exposure to covid-19: Secondary | ICD-10-CM | POA: Diagnosis not present

## 2019-09-26 DIAGNOSIS — G894 Chronic pain syndrome: Secondary | ICD-10-CM | POA: Insufficient documentation

## 2019-09-26 HISTORY — DX: Essential (primary) hypertension: I10

## 2019-09-26 HISTORY — DX: Other psychoactive substance abuse, uncomplicated: F19.10

## 2019-09-26 MED ORDER — ONDANSETRON 8 MG PO TBDP: 8.0000 mg | ORAL_TABLET | Freq: Three times a day (TID) | ORAL | 0 refills | Status: DC | PRN

## 2019-09-26 NOTE — Telephone Encounter (Signed)
Pt woke up and took medication after a while he started throwing up and has been like that ever since, no other symptoms. Please advise

## 2019-09-26 NOTE — ED Provider Notes (Signed)
[facility]    CSN: [id number] Arrival date & time: 09/26/19  1110      History   Chief Complaint Chief Complaint  Patient presents with  . Emesis    HPI Christian Doyle is a 29 y.o. male.   29 yo male with a c/o vomiting that started this morning with chills. Denies any fevers, diarrhea, abdominal pains. No known sick contacts or suspicious foods. States he's vomited about 4 times.    Emesis   Past Medical History:  Diagnosis Date  . Drug abuse (HCC)   . Hypertension     Patient Active Problem List   Diagnosis Date Noted  . Chronic pain syndrome 06/19/2017  . Opioid dependence in remission (HCC) 06/19/2017    Past Surgical History:  Procedure Laterality Date  . TONSILLECTOMY         Home Medications    Prior to Admission medications   Medication Sig Start Date End Date Taking? Authorizing Provider  furosemide (LASIX) 20 MG tablet Take 1 tablet by mouth daily. 12/21/18  Yes [provider]  lisinopril-hydrochlorothiazide (ZESTORETIC) 10-12.5 MG tablet Take 1 tablet by mouth daily. 12/15/18  Yes [provider]  methadone (DOLOPHINE) 10 MG/ML solution 100 mg per day 06/19/17  Yes [provider]  potassium chloride (K-DUR) 10 MEQ tablet Take 1 tablet by mouth daily. 12/21/18  Yes [provider]  ondansetron (ZOFRAN ODT) 8 MG disintegrating tablet Take 1 tablet (8 mg total) by mouth every 8 (eight) hours as needed. 09/26/19   Payton Mccallum, MD    Family History Family History  Problem Relation Age of Onset  . Healthy Mother   . Healthy Father     Social History Social History   Tobacco Use  . Smoking status: Current Every Day Smoker    Types: Cigarettes  . Smokeless tobacco: Former Engineer, water Use Topics  . Alcohol use: Never  . Drug use: Yes    Types: Marijuana     Allergies   Patient has no known allergies.   Review of Systems Review of Systems  Gastrointestinal: Positive for  vomiting.     Physical Exam Triage Vital Signs ED Triage Vitals  Enc Vitals Group     BP 09/26/19 1142 126/74     Pulse Rate 09/26/19 1142 75     Resp --      Temp 09/26/19 1142 98.4 F (36.9 C)     Temp Source 09/26/19 1142 Oral     SpO2 09/26/19 1142 100 %     Weight 09/26/19 1139 225 lb (102.1 kg)     Height 09/26/19 1139 6' (1.829 m)     Head Circumference --      Peak Flow --      Pain Score 09/26/19 1139 0     Pain Loc --      Pain Edu? --      Excl. in GC? --    No data found.  Updated Vital Signs BP 126/74 (BP Location: Left Arm)   Pulse 75   Temp 98.4 F (36.9 C) (Oral)   Ht 6' (1.829 m)   Wt 102.1 kg   SpO2 100%   BMI 30.52 kg/m   Visual Acuity Right Eye Distance:   Left Eye Distance:   Bilateral Distance:    Right Eye Near:   Left Eye Near:    Bilateral Near:     Physical Exam Vitals and nursing note reviewed.  Constitutional:  General: He is not in acute distress.    Appearance: He is not toxic-appearing or diaphoretic.  Cardiovascular:     Rate and Rhythm: Normal rate.  Abdominal:     General: Bowel sounds are normal. There is no distension.     Palpations: Abdomen is soft. There is no mass.     Tenderness: There is no abdominal tenderness. There is no right CVA tenderness, left CVA tenderness, guarding or rebound.     Hernia: No hernia is present.  Neurological:     Mental Status: He is alert.      UC Treatments / Results  Labs (all labs ordered are listed, but only abnormal results are displayed) Labs Reviewed  NOVEL CORONAVIRUS, NAA (HOSP ORDER, SEND-OUT TO REF LAB; TAT 18-24 HRS)    EKG   Radiology No results found.  Procedures Procedures (including critical care time)  Medications Ordered in UC Medications - No data to display  Initial Impression / Assessment and Plan / UC Course  I have reviewed the triage vital signs and the nursing notes.  Pertinent labs & imaging results that were available during my care  of the patient were reviewed by me and considered in my medical decision making (see chart for details).      Final Clinical Impressions(s) / UC Diagnoses   Final diagnoses:  Viral gastroenteritis     Discharge Instructions     Rest, fluids (pedialyte, gatorade, water, etc)     ED Prescriptions    Medication Sig Dispense Auth. Provider   ondansetron (ZOFRAN ODT) 8 MG disintegrating tablet Take 1 tablet (8 mg total) by mouth every 8 (eight) hours as needed. 6 tablet Norval Gable, MD     1. diagnosis reviewed with patient 2. rx as per orders above; reviewed possible side effects, interactions, risks and benefits  3. Recommend supportive treatment as above 4. covid test done 5. Follow-up prn if symptoms worsen or don't improve   PDMP not reviewed this encounter.   Norval Gable, MD 09/26/19 1537

## 2019-09-26 NOTE — Discharge Instructions (Addendum)
Rest, fluids (pedialyte, gatorade, water, etc)

## 2019-09-26 NOTE — ED Triage Notes (Signed)
Pt presents with c/o emesis that started this morning along with hot/cold chills. He states he does take methadone and threw that up this morning. He states he feels very ill. He denies any known sick contacts. He reports his last meal was fresh fruit, soup and a chicken sandwich. He states he did start feeling unwell last night but began to feel much worse this morning and that is when he began to vomit uncontrollably. He states he feels very weak.

## 2019-09-27 LAB — NOVEL CORONAVIRUS, NAA (HOSP ORDER, SEND-OUT TO REF LAB; TAT 18-24 HRS): SARS-CoV-2, NAA: NOT DETECTED

## 2019-10-03 ENCOUNTER — Other Ambulatory Visit: Payer: Self-pay

## 2019-10-03 ENCOUNTER — Encounter: Payer: Self-pay | Admitting: Family Medicine

## 2019-10-03 ENCOUNTER — Ambulatory Visit (INDEPENDENT_AMBULATORY_CARE_PROVIDER_SITE_OTHER): Payer: Self-pay | Admitting: Family Medicine

## 2019-10-03 VITALS — BP 120/78 | HR 80 | Ht 72.0 in | Wt 233.0 lb

## 2019-10-03 DIAGNOSIS — R160 Hepatomegaly, not elsewhere classified: Secondary | ICD-10-CM

## 2019-10-03 DIAGNOSIS — I10 Essential (primary) hypertension: Secondary | ICD-10-CM

## 2019-10-03 DIAGNOSIS — Z7689 Persons encountering health services in other specified circumstances: Secondary | ICD-10-CM

## 2019-10-03 DIAGNOSIS — E669 Obesity, unspecified: Secondary | ICD-10-CM

## 2019-10-03 MED ORDER — LISINOPRIL-HYDROCHLOROTHIAZIDE 20-12.5 MG PO TABS: 1.0000 | ORAL_TABLET | Freq: Every day | ORAL | 1 refills | Status: DC

## 2019-10-03 NOTE — Patient Instructions (Signed)

## 2019-10-03 NOTE — Progress Notes (Signed)
Date:  10/03/2019   Name:  Christian Doyle   DOB:  [birth year]   MRN:  [id number]   Chief Complaint: Hypertension  Hypertension This is a chronic problem. The current episode started more than 1 year ago. The problem has been gradually improving since onset. The problem is controlled. Pertinent negatives include no anxiety, blurred vision, chest pain, headaches, malaise/fatigue, neck pain, orthopnea, palpitations, peripheral edema, PND, shortness of breath or sweats. There are no associated agents to hypertension. Risk factors for coronary artery disease include dyslipidemia. Past treatments include ACE inhibitors and diuretics. The current treatment provides moderate improvement. There are no compliance problems.  There is no history of angina, kidney disease, CAD/MI, CVA, heart failure, left ventricular hypertrophy, PVD or retinopathy. There is no history of chronic renal disease, a hypertension causing med or renovascular disease.    Lab Results  Component Value Date   CREATININE 0.89 10/28/2012   BUN 14 10/28/2012   NA 138 10/28/2012   K 4.0 10/28/2012   CL 104 10/28/2012   CO2 26 10/28/2012   No results found for: CHOL, HDL, LDLCALC, LDLDIRECT, TRIG, CHOLHDL Lab Results  Component Value Date   TSH 1.08 10/28/2012   No results found for: HGBA1C   Review of Systems  Constitutional: Negative for chills, fever and malaise/fatigue.  HENT: Negative for drooling, ear discharge, ear pain and sore throat.   Eyes: Negative for blurred vision.  Respiratory: Negative for cough, shortness of breath and wheezing.   Cardiovascular: Negative for chest pain, palpitations, orthopnea, leg swelling and PND.  Gastrointestinal: Negative for abdominal pain, blood in stool, constipation, diarrhea and nausea.  Endocrine: Negative for polydipsia.  Genitourinary: Negative for dysuria, frequency, hematuria and urgency.  Musculoskeletal: Negative for back pain, myalgias and neck pain.  Skin: Negative  for rash.  Allergic/Immunologic: Negative for environmental allergies.  Neurological: Negative for dizziness and headaches.  Hematological: Does not bruise/bleed easily.  Psychiatric/Behavioral: Negative for suicidal ideas. The patient is not nervous/anxious.     Patient Active Problem List   Diagnosis Date Noted  . Chronic pain syndrome 06/19/2017  . Opioid dependence in remission (HCC) 06/19/2017    No Known Allergies  Past Surgical History:  Procedure Laterality Date  . TONSILLECTOMY      Social History   Tobacco Use  . Smoking status: Current Every Day Smoker    Types: Cigarettes  . Smokeless tobacco: Former Engineer, water Use Topics  . Alcohol use: Never  . Drug use: Yes    Types: Marijuana     Medication list has been reviewed and updated.  Current Meds  Medication Sig  . lisinopril-hydrochlorothiazide (ZESTORETIC) 20-12.5 MG tablet Take 1 tablet by mouth daily.  . methadone (DOLOPHINE) 10 MG/ML solution [facility] 2/9 Scores 10/03/2019  PHQ - 2 Score 0  PHQ- 9 Score 0    BP Readings from Last 3 Encounters:  10/03/19 120/78  09/26/19 126/74  01/11/19 120/70    Physical Exam Vitals and nursing note reviewed.  HENT:     Head: Normocephalic.     Right Ear: Tympanic membrane, ear canal and external ear normal.     Left Ear: Tympanic membrane, ear canal and external ear normal.     Nose: Nose normal. No congestion or rhinorrhea.     Mouth/Throat:     Mouth: Mucous membranes are moist.  Eyes:     General: No scleral icterus.       Right eye: No  discharge.        Left eye: No discharge.     Conjunctiva/sclera: Conjunctivae normal.     Pupils: Pupils are equal, round, and reactive to light.  Neck:     Thyroid: No thyromegaly.     Vascular: No JVD.     Trachea: No tracheal deviation.  Cardiovascular:     Rate and Rhythm: Normal rate and regular rhythm.     Pulses:          Carotid pulses are 2+ on the right side and 2+ on the left  side.      Radial pulses are 2+ on the right side and 2+ on the left side.       Femoral pulses are 2+ on the right side and 2+ on the left side.      Popliteal pulses are 2+ on the right side and 2+ on the left side.       Dorsalis pedis pulses are 2+ on the right side and 2+ on the left side.       Posterior tibial pulses are 2+ on the right side and 2+ on the left side.     Heart sounds: Normal heart sounds, S1 normal and S2 normal. No murmur. No systolic murmur. No diastolic murmur. No friction rub. No gallop. No S3 or S4 sounds.   Pulmonary:     Effort: No respiratory distress.     Breath sounds: Normal breath sounds. No decreased breath sounds, wheezing, rhonchi or rales.  Abdominal:     General: Bowel sounds are normal.     Palpations: Abdomen is soft. There is hepatomegaly. There is no splenomegaly or mass.     Tenderness: There is no abdominal tenderness. There is no right CVA tenderness, left CVA tenderness, guarding or rebound.     Hernia: No hernia is present.  Musculoskeletal:        General: No tenderness. Normal range of motion.     Cervical back: Normal range of motion and neck supple. No rigidity.     Right lower leg: No edema.     Left lower leg: No edema.  Lymphadenopathy:     Head:     Right side of head: No submental or submandibular adenopathy.     Left side of head: No submental or submandibular adenopathy.     Cervical: No cervical adenopathy.     Right cervical: No superficial, deep or posterior cervical adenopathy.    Left cervical: No superficial, deep or posterior cervical adenopathy.     Upper Body:     Right upper body: No supraclavicular or axillary adenopathy.     Left upper body: No supraclavicular or axillary adenopathy.  Skin:    General: Skin is warm.     Findings: No rash.  Neurological:     Mental Status: He is alert and oriented to person, place, and time.     Cranial Nerves: No cranial nerve deficit.     Sensory: Sensation is intact.      Motor: Motor function is intact.     Deep Tendon Reflexes: Reflexes are normal and symmetric.     Wt Readings from Last 3 Encounters:  10/03/19 233 lb (105.7 kg)  09/26/19 225 lb (102.1 kg)  01/11/19 231 lb (104.8 kg)    BP 120/78   Pulse 80   Ht 6' (1.829 m)   Wt 233 lb (105.7 kg)   BMI 31.60 kg/m   Assessment and Plan:  1. Establishing  care with new doctor, encounter for Patient establishes with new physician today.  Patient was given information for anxiety to go to [place] [facility] for evaluation and treatment.  2. Hepatomegaly During the course of the encounter patient was noted to have a palpable liver edge that is smooth.  Patient admits that they had approached him about being evaluated for hepatitis C which he was unable to continue with.  We will check a lipid panel to make sure there is no familial hyperlipidemia as well as a hepatitis C antibody with reflex and a hepatitic function panel. - Lipid Panel With LDL/HDL Ratio - Hepatitis c antibody (reflex) - Hepatic Function Panel (6)  3. Essential hypertension Patient has history of hypertension which is controlled.  Will continue lisinopril hydrochlorothiazide 20-12.5 mg daily and will recheck in 6 months.  In the meantime we will get a renal function panel to evaluate GFR. - Renal Function Panel - lisinopril-hydrochlorothiazide (ZESTORETIC) 20-12.5 MG tablet; Take 1 tablet by mouth daily.  Dispense: 90 tablet; Refill: 1  4. Obesity (BMI 30.0-34.9) Health risks of being over weight were discussed and patient was counseled on weight loss options and exercise.  Dietary sheets were given to the patient from the [place] [facility].                                5.  Patient has been having pain going down his right leg.  I suggested to the patient this is likely a radicular nature of pain perhaps secondary to a disc and back.  If this is to continue we may need to refer and/or do further evaluation with x-rays patient is  to return if this continues.

## 2019-10-04 ENCOUNTER — Telehealth: Payer: Self-pay

## 2019-10-04 ENCOUNTER — Other Ambulatory Visit: Payer: Self-pay

## 2019-10-04 ENCOUNTER — Encounter: Payer: Self-pay | Admitting: Family Medicine

## 2019-10-04 DIAGNOSIS — R768 Other specified abnormal immunological findings in serum: Secondary | ICD-10-CM

## 2019-10-04 DIAGNOSIS — R7989 Other specified abnormal findings of blood chemistry: Secondary | ICD-10-CM

## 2019-10-04 LAB — COMMENT2 - HEP PANEL

## 2019-10-04 LAB — LIPID PANEL WITH LDL/HDL RATIO
Cholesterol, Total: 175 mg/dL (ref 100–199)
HDL: 63 mg/dL (ref >39)
LDL Chol Calc (NIH): 99 mg/dL (ref 0–99)
LDL/HDL Ratio: 1.6 ratio (ref 0.0–3.6)
Triglycerides: 71 mg/dL (ref 0–149)
VLDL Cholesterol Cal: 13 mg/dL (ref 5–40)

## 2019-10-04 LAB — HEPATIC FUNCTION PANEL (6)
ALT: 80 IU/L — ABNORMAL HIGH (ref 0–44)
AST: 53 IU/L — ABNORMAL HIGH (ref 0–40)
Alkaline Phosphatase: 98 IU/L (ref 39–117)
Bilirubin Total: 0.3 mg/dL (ref 0.0–1.2)
Bilirubin, Direct: 0.1 mg/dL (ref 0.00–0.40)

## 2019-10-04 LAB — RENAL FUNCTION PANEL
Albumin: 4.8 g/dL (ref 4.1–5.2)
BUN/Creatinine Ratio: 14 (ref 9–20)
BUN: 14 mg/dL (ref 6–20)
CO2: 23 mmol/L (ref 20–29)
Calcium: 10.1 mg/dL (ref 8.7–10.2)
Chloride: 101 mmol/L (ref 96–106)
Creatinine, Ser: 1.02 mg/dL (ref 0.76–1.27)
GFR calc Af Amer: 115 mL/min/{1.73_m2} (ref >59)
GFR calc non Af Amer: 100 mL/min/{1.73_m2} (ref >59)
Glucose: 96 mg/dL (ref 65–99)
Phosphorus: 3 mg/dL (ref 2.8–4.1)
Potassium: 4.4 mmol/L (ref 3.5–5.2)
Sodium: 140 mmol/L (ref 134–144)

## 2019-10-04 LAB — HEPATITIS C ANTIBODY (REFLEX): HCV Ab: >11 s/co ratio — ABNORMAL HIGH (ref 0.0–0.9)

## 2019-10-04 NOTE — Progress Notes (Unsigned)
Entered Inf disease referral

## 2019-10-04 NOTE — Telephone Encounter (Signed)
I notified pt of hep C results and elevated liver enzymes. We added Hepatitis Quantitative and HIV to labs- test numbers [id number] and [id number] added on Code number B19.20

## 2019-10-07 LAB — SPECIMEN STATUS REPORT

## 2019-10-07 LAB — HEPATITIS C GENOTYPE

## 2019-10-07 LAB — HCV RNA QUANT RFLX ULTRA OR GENOTYP
HCV Quant Baseline: 761000 IU/mL
HCV log10: 5.881 log10 IU/mL

## 2019-10-20 ENCOUNTER — Encounter: Payer: Self-pay | Admitting: Infectious Diseases

## 2019-10-27 ENCOUNTER — Other Ambulatory Visit: Payer: Self-pay

## 2019-10-27 ENCOUNTER — Ambulatory Visit: Payer: Self-pay | Attending: Infectious Diseases | Admitting: Infectious Diseases

## 2019-10-27 ENCOUNTER — Encounter: Payer: Self-pay | Admitting: Infectious Diseases

## 2019-10-27 VITALS — BP 117/74 | HR 84 | Resp 16 | Ht 72.0 in | Wt 236.0 lb

## 2019-10-27 DIAGNOSIS — I10 Essential (primary) hypertension: Secondary | ICD-10-CM

## 2019-10-27 DIAGNOSIS — F1990 Other psychoactive substance use, unspecified, uncomplicated: Secondary | ICD-10-CM

## 2019-10-27 DIAGNOSIS — F17210 Nicotine dependence, cigarettes, uncomplicated: Secondary | ICD-10-CM

## 2019-10-27 DIAGNOSIS — B182 Chronic viral hepatitis C: Secondary | ICD-10-CM

## 2019-10-27 NOTE — Progress Notes (Signed)
NAME: Christian Doyle  DOB: 1990-10-11  MRN: [id number]  Date/Time: 10/27/2019 9:18 AM  REQUESTING PROVIDER:Dr.Jones Subjective:  REASON FOR CONSULT: for Hepatitis C ? Christian Doyle is a 29 y.o. 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 Pt has been aware of HEPC since 2 years Is on a methadone program 120mg   He has been clean for 4 years He now has burning feet and fatigue and poor sleep. He saw Dr.Jones  PCP on 10/03/19 and she felt an enlarged liver and sent labs- CMP hepc and it came positive and he was referred to me  Past Medical History:  Diagnosis Date  . Drug abuse ([place])   . Drug addiction in remission ([place])   . Hypertension     Past Surgical History:  Procedure Laterality Date  . TONSILLECTOMY      Social History   Socioeconomic History  . Marital status: Single    Spouse name: Not on file  . Number of children: Not on file  . Years of education: Not on file  . Highest education level: Not on file  Occupational History  . Not on file  Tobacco Use  . Smoking status: Current Every Day Smoker    Packs/day: 0.50    Years: 12.00    Pack years: 6.00    Types: Cigarettes  . Smokeless tobacco: Former Network engineer and Sexual Activity  . Alcohol use: Never  . Drug use: Yes    Types: Marijuana, Heroin, Oxycodone    Comment: prescribed methadone  . Sexual activity: Yes  Other Topics Concern  . Not on file  Social History Narrative   Patient is on Methadone for substance abuse. [facility] [place], [place]. Patient Sober 4 years. Living with parents now.    Social Determinants of Health   Financial Resource Strain:   . Difficulty of Paying Living Expenses: Not on file  Food Insecurity:   . Worried About Charity fundraiser in the Last Year: Not on file  . Ran Out of Food in the Last Year: Not on file   Transportation Needs:   . Lack of Transportation (Medical): Not on file  . Lack of Transportation (Non-Medical): Not on file  Physical Activity:   . Days of Exercise per Week: Not on file  . Minutes of Exercise per Session: Not on file  Stress:   . Feeling of Stress : Not on file  Social Connections:   . Frequency of Communication with Friends and Family: Not on file  . Frequency of Social Gatherings with Friends and Family: Not on file  . Attends Religious Services: Not on file  . Active Member of Clubs or Organizations: Not on file  . Attends Archivist Meetings: Not on file  . Marital Status: Not on file  Intimate Partner Violence:   . Fear of Current or Ex-Partner: Not on file  . Emotionally Abused: Not on file  . Physically Abused: Not on file  . Sexually Abused: Not on file    Family History  Problem Relation Age of Onset  . Healthy Mother   . Healthy Father    No Known Allergies  ? Current Outpatient Medications  Medication Sig Dispense Refill  . lisinopril-hydrochlorothiazide (ZESTORETIC) 20-12.5 MG tablet Take 1 tablet by mouth daily.  90 tablet 1  . methadone (DOLOPHINE) 1 mg/ml oral solution Take 120 mg/kg by mouth daily.     No current facility-administered medications for this visit.     Abtx:  Anti-infectives (From admission, onward)   None      REVIEW OF SYSTEMS:  Const: negative fever, negative chills, negative weight loss Eyes: negative diplopia or visual changes, negative eye pain ENT: negative coryza, negative sore throat Resp: negative cough, hemoptysis, dyspnea Cards: negative for chest pain, palpitations, lower extremity edema GU: negative for frequency, dysuria and hematuria GI: Negative for abdominal pain, diarrhea, bleeding, constipation Skin: negative for rash and pruritus Heme: negative for easy bruising and gum/nose bleeding MS:  myalgias, arthralgias hands ,fatigue Neurolo:negative for headaches, dizziness, vertigo, memory  problems, burning feet   Psych: negative for feelings of anxiety, depression  Endocrine: negative for thyroid, diabetes Allergy/Immunology- negative for any medication or food allergies  Objective:  VITALS:  BP 117/74   Pulse 84   Resp 16   Ht 6' (1.829 m)   Wt 236 lb (107 kg)   SpO2 98%   BMI 32.01 kg/m  PHYSICAL EXAM:  General: Alert, cooperative, no distress, appears stated age.  Head: Normocephalic, without obvious abnormality, atraumatic. Eyes: Conjunctivae clear, anicteric sclerae. Pupils are equal ENT Nares normal. No drainage or sinus tenderness. Lips, mucosa, and tongue normal. No Thrush Neck: Supple, symmetrical, no adenopathy, thyroid: non tender no carotid bruit and no JVD. Back: No CVA tenderness. Lungs: Clear to auscultation bilaterally. No Wheezing or Rhonchi. No rales. Heart: Regular rate and rhythm, no murmur, rub or gallop. Abdomen: Soft, non-tender,not distended. Bowel sounds normal. No masses Extremities: mild puffiness hands and feet  Skin: No rashes or lesions. Or bruising Lymph: Cervical, supraclavicular normal. Neurologic: Grossly non-focal Pertinent Labs  Tests result DATE comment  HEPC RNA 761,000 10/03/19   HEPC  Genotype 1a 10/03/19   Hepatitis B profile     Hepatitis A status     PT/PTT     AST, ALT, Bilirubin 53/80/0.3 10/03/19   Albumin     creatinine     HB/Platelet     HIV     AFP     TSH     comorbidities      tobacco     alcohol     drug     APRI Score     FIB 4 score     Current meds     Ultrasound Elastography                ? Impression/Recommendation ? ?Chronic hepatitis C  without decompensation- he wants to be treated He has no insurance He needs a few labs and also imaging  Discussed with his mom and patient that I will check to see whether he can be treated at [facility] especially with me being away next month Contacted [facility]- pharmacist Cassie Kuppelweiser They will contact  him and get him there for treatment ?

## 2019-10-27 NOTE — Patient Instructions (Addendum)
You are here for hepatitis C management- You currently do not have insurance You will need lab work and ultrasound imaging before you can be treated. Will see whether [facility] or [facility] charity can help- will call you with the details Other option is [facility] charity care- [phone]

## 2019-10-31 ENCOUNTER — Telehealth: Payer: Self-pay | Admitting: Pharmacist

## 2019-10-31 NOTE — Telephone Encounter (Signed)
Patient is a new Hep C patient of Dr. Rivka Safer. She is out of town this month and wishes for him to be seen by [facility] for medication assistance (he is uninsured) and treatment follow up.  Called patient to schedule appointment with me but had to leave a HIPAA compliant VM.

## 2019-11-02 ENCOUNTER — Telehealth: Payer: Self-pay

## 2019-11-02 NOTE — Telephone Encounter (Signed)
Rcvd VM from patient mother asking about [facility] appt and charity care. I saw where Cassie from [facility] attempted to schedule appt but had to leave VM. I reached out to [facility] and was told he is set up at [facility] for Tuesday and this was set up Monday. I will explain to mom that patient is being taken care of and she can communicate with him if need to but we have his appt set up.

## 2019-11-08 ENCOUNTER — Telehealth: Payer: Self-pay | Admitting: Pharmacy Technician

## 2019-11-08 ENCOUNTER — Ambulatory Visit (INDEPENDENT_AMBULATORY_CARE_PROVIDER_SITE_OTHER): Payer: Self-pay | Admitting: Pharmacist

## 2019-11-08 ENCOUNTER — Other Ambulatory Visit: Payer: Self-pay

## 2019-11-08 DIAGNOSIS — B182 Chronic viral hepatitis C: Secondary | ICD-10-CM

## 2019-11-08 NOTE — Progress Notes (Signed)
HPI: Christian Doyle is a 29 y.o. male who presents to the [facility] pharmacy clinic for Hepatitis C follow-up.  Medication: TBD  Start Date: TBD  Hepatitis C Genotype: 1a  Fibrosis Score: TBD  Hepatitis C RNA: 761,000 on 10/03/19  Patient Active Problem List   Diagnosis Date Noted  . Essential hypertension 10/03/2019  . Chronic pain syndrome 06/19/2017  . Opioid dependence in remission (H[facility]) 06/19/2017    Patient's Medications  New Prescriptions   No medications on file  Previous Medications   LISINOPRIL-HYDROCHLOROTHIAZIDE (ZESTORETIC) 20-12.5 MG TABLET    Take 1 tablet by mouth daily.   METHADONE (DOLOPHINE) 1 MG/ML ORAL SOLUTION    Take 120 mg/kg by mouth daily.  Modified Medications   No medications on file  Discontinued Medications   No medications on file    Allergies: No Known Allergies  Past Medical History: Past Medical History:  Diagnosis Date  . Drug abuse ([facility])   . Drug addiction in remission ([facility])   . Hypertension     Social History: Social History   Socioeconomic History  . Marital status: Single    Spouse name: Not on file  . Number of children: Not on file  . Years of education: Not on file  . Highest education level: Not on file  Occupational History  . Not on file  Tobacco Use  . Smoking status: Current Every Day Smoker    Packs/day: 0.50    Years: 12.00    Pack years: 6.00    Types: Cigarettes  . Smokeless tobacco: Former Engineer, water and Sexual Activity  . Alcohol use: Never  . Drug use: Yes    Types: Marijuana, Heroin, Oxycodone    Comment: prescribed methadone  . Sexual activity: Yes  Other Topics Concern  . Not on file  Social History Narrative   Patient is on Methadone for substance abuse. [facility] [place], [place]. Patient Sober 4 years. Living with parents now.    Social Determinants of Health   Financial Resource Strain:   . Difficulty of Paying Living Expenses: Not on file  Food Insecurity:   . Worried  About Programme researcher, broadcasting/film/video in the Last Year: Not on file  . Ran Out of Food in the Last Year: Not on file  Transportation Needs:   . Lack of Transportation (Medical): Not on file  . Lack of Transportation (Non-Medical): Not on file  Physical Activity:   . Days of Exercise per Week: Not on file  . Minutes of Exercise per Session: Not on file  Stress:   . Feeling of Stress : Not on file  Social Connections:   . Frequency of Communication with Friends and Family: Not on file  . Frequency of Social Gatherings with Friends and Family: Not on file  . Attends Religious Services: Not on file  . Active Member of Clubs or Organizations: Not on file  . Attends Banker Meetings: Not on file  . Marital Status: Not on file    Labs: Hepatitis C Lab Results  Component Value Date   HCVGENOTYPE Comment 10/03/2019   Hepatitis B No results found for: HEPBSAB, HEPBSAG, HEPBCAB Hepatitis A No results found for: HAV HIV No results found for: HIV Lab Results  Component Value Date   CREATININE 1.02 10/03/2019   CREATININE 0.89 10/28/2012   CREATININE 1.02 11/04/2011   Lab Results  Component Value Date   AST 53 (H) 10/03/2019   AST 32 10/28/2012  AST 22 11/04/2011   ALT 80 (H) 10/03/2019   ALT 73 10/28/2012   ALT 28 11/04/2011   INR 1.0 11/08/2019    Assessment: Christian Doyle is here today to see me for Hepatitis C follow up.  He recently saw Dr. Delaine Lame at [facility] but was referred to me for financial assistance for labs and medication. Patient has genotype 1a Hep C with an initial Hep C RNA of 761,000.  Previous platelet function was slightly low in 2013 and 2014 so will recheck today.  Will also check a liver fibrosis panel to get an accurate F score.   I gave him a [facility] financial application and explained to him how to fill it out and mail to the financial aid office at [facility].  He also filled out a [facility] lab assistance form and it was given to the lab staff at the clinic to turn  in.  He also signed paperwork with Adonis Brook for [employer] Raeanne Gathers or [place]) and [employer]). Will determine medication once labs have returned.  Will call patient when a decision has been made.   Plan: - CBC, liver fibrosis panel, INR today - Recheck HIV at next visit - F/u once labs have returned  Arabela Basaldua L. Kalani Baray, PharmD, BCIDP, AAHIVP, CPP Clinical Pharmacist Practitioner Infectious Diseases [place] for Infectious Disease 11/09/2019, 2:25 PM

## 2019-11-08 NOTE — Patient Instructions (Signed)
[facility] [phone]

## 2019-11-08 NOTE — Telephone Encounter (Signed)
[facility] Patient Advocate Encounter    Findings of the benefits investigation:   Insurance: uninsured  Both applications for assistance were filled out and signed. Once new labs return we will submit to the patient assistance program. Patient will be listed household size 1 and income $1500/month. The address was confirmed for shipment.  Beulah Gandy, CPhT Specialty Pharmacy Patient [facility] for Infectious Disease Phone: [phone] Fax: [phone] 11/08/2019 9:38 AM

## 2019-11-11 LAB — CBC
HCT: 40.4 % (ref 38.5–50.0)
Hemoglobin: 13.4 g/dL (ref 13.2–17.1)
MCH: 30.9 pg (ref 27.0–33.0)
MCHC: 33.2 g/dL (ref 32.0–36.0)
MCV: 93.3 fL (ref 80.0–100.0)
MPV: 11.5 fL (ref 7.5–12.5)
Platelets: 176 10*3/uL (ref 140–400)
RBC: 4.33 10*6/uL (ref 4.20–5.80)
RDW: 12.3 % (ref 11.0–15.0)
WBC: 7.7 10*3/uL (ref 3.8–10.8)

## 2019-11-11 LAB — LIVER FIBROSIS, FIBROTEST-ACTITEST
ALT: 62 U/L — ABNORMAL HIGH (ref 9–46)
Alpha-2-Macroglobulin: 204 mg/dL (ref 106–279)
Apolipoprotein A1: 181 mg/dL — ABNORMAL HIGH (ref 94–176)
Bilirubin: 0.3 mg/dL (ref 0.2–1.2)
Fibrosis Score: 0.14
GGT: 137 U/L — ABNORMAL HIGH (ref 3–70)
Haptoglobin: 103 mg/dL (ref 43–212)
Necroinflammat ACT Score: 0.31
Reference ID: 3303440

## 2019-11-11 LAB — PROTIME-INR
INR: 1
Prothrombin Time: 10.2 s (ref 9.0–11.5)

## 2019-11-17 ENCOUNTER — Other Ambulatory Visit: Payer: Self-pay | Admitting: Pharmacist

## 2019-11-17 DIAGNOSIS — B182 Chronic viral hepatitis C: Secondary | ICD-10-CM

## 2019-11-17 MED ORDER — LEDIPASVIR-SOFOSBUVIR 90-400 MG PO TABS: 1.0000 | ORAL_TABLET | Freq: Every day | ORAL | 1 refills | Status: DC

## 2019-11-17 MED ORDER — MAVYRET 100-40 MG PO TABS: 3.0000 | ORAL_TABLET | Freq: Every day | ORAL | 1 refills | Status: DC

## 2019-11-17 NOTE — Progress Notes (Addendum)
Sending Mavyret x 8 weeks for patient's Hepatitis C infection.

## 2019-11-17 NOTE — Addendum Note (Signed)
Addended by: Robinette Haines on: 11/17/2019 04:48 PM   Modules accepted: Orders

## 2019-11-18 NOTE — Telephone Encounter (Addendum)
[facility] Patient Advocate Encounter  Application for Mavyret was signed and faxed today to [facility] Assist for determination. Will update the patient once we hear determination.  Patient was approved   Spoke to the patient's mother this morning and provided her the number to call [employer] and set up first shipment. [phone] Doing this will speed up the process or they can wait for the company to call. She was also sent the form for [facility] patient assistance. If there are any further questions, she knows to call us back directly.

## 2019-11-24 NOTE — Telephone Encounter (Signed)
He filled out the [facility] form when he saw me earlier this month. Thanks Lorene Dy!

## 2019-12-09 ENCOUNTER — Telehealth: Payer: Self-pay | Admitting: *Deleted

## 2019-12-09 NOTE — Telephone Encounter (Signed)
Patient received Mavyret, will start 12/10/19 Andree Coss, RN

## 2019-12-12 ENCOUNTER — Encounter: Payer: Self-pay | Admitting: Pharmacy Technician

## 2019-12-12 NOTE — Telephone Encounter (Signed)
Excellent! He was on the list to check and see if the medication arrived. Thank you, I will get in touch with the patient for scheduling the follow up visit.

## 2019-12-13 ENCOUNTER — Telehealth: Payer: Self-pay

## 2019-12-13 NOTE — Telephone Encounter (Signed)
I spoke with Zack today about his new HCV medication Mavyret. He already started this medication on 12/10/2019. He has no complaints so far, and he describes taking Mavyret appropriately - 3 tablets at the same time daily with food. He will continue this regimen for a total of 8 weeks. We discussed the most common adverse effects of headache and fatigue, and so far he has only experienced some mild fatigue. I stressed the importance of not missing any doses if at all possible to give him the best chance at achieving clinical cure. I advised him to call the clinic if he has any issues with the medication, prior to discontinuing the medication for any reason, or if he starts any new medications. All of his questions and concerns were addressed at this time. Lorene Dy will follow up with him about scheduling his first follow-up appointment.   Ellison Carwin, PharmD PGY1 Pharmacy Resident

## 2019-12-13 NOTE — Telephone Encounter (Signed)
Attempted to call Christian Doyle to counsel him on his new medication Mavyret. I was able to get in touch with his mother who said he is at work, and I also left a HIPAA compliant voicemail to his direct line asking him to call back when available.

## 2019-12-14 NOTE — Telephone Encounter (Signed)
Agree with Emily's assessment!

## 2019-12-27 ENCOUNTER — Telehealth: Payer: Self-pay | Admitting: Pharmacy Technician

## 2019-12-27 NOTE — Telephone Encounter (Signed)
PHARMACY ADVOCATE ENCOUNTER  Patient called in needing a refill of Mavyret.  I transferred him to [facility] patient assistance who will ship the medication to him.  Netty Starring. Dimas Aguas CPhT Specialty Pharmacy Patient [facility] for Infectious Disease Phone: [phone] Fax:  [phone]

## 2020-01-06 ENCOUNTER — Encounter: Payer: Self-pay | Admitting: Pharmacist

## 2020-01-06 ENCOUNTER — Other Ambulatory Visit: Payer: Self-pay | Admitting: Pharmacist

## 2020-01-06 DIAGNOSIS — Z79899 Other long term (current) drug therapy: Secondary | ICD-10-CM

## 2020-01-06 DIAGNOSIS — B182 Chronic viral hepatitis C: Secondary | ICD-10-CM

## 2020-01-06 NOTE — Progress Notes (Signed)
Labs for Hep C

## 2020-01-09 ENCOUNTER — Other Ambulatory Visit: Payer: Self-pay

## 2020-01-09 ENCOUNTER — Ambulatory Visit: Payer: Self-pay | Admitting: Pharmacist

## 2020-01-09 DIAGNOSIS — Z79899 Other long term (current) drug therapy: Secondary | ICD-10-CM

## 2020-01-09 DIAGNOSIS — B182 Chronic viral hepatitis C: Secondary | ICD-10-CM

## 2020-01-10 ENCOUNTER — Encounter: Payer: Self-pay | Admitting: Pharmacist

## 2020-01-11 LAB — HEPATITIS C RNA QUANTITATIVE
HCV Quantitative Log: <1.18 Log IU/mL
HCV RNA, PCR, QN: <15 IU/mL

## 2020-01-11 LAB — COMPREHENSIVE METABOLIC PANEL
AG Ratio: 2.1 (calc) (ref 1.0–2.5)
ALT: 19 U/L (ref 9–46)
AST: 20 U/L (ref 10–40)
Albumin: 4.6 g/dL (ref 3.6–5.1)
Alkaline phosphatase (APISO): 73 U/L (ref 36–130)
BUN: 13 mg/dL (ref 7–25)
CO2: 32 mmol/L (ref 20–32)
Calcium: 9.6 mg/dL (ref 8.6–10.3)
Chloride: 100 mmol/L (ref 98–110)
Creat: 0.96 mg/dL (ref 0.60–1.35)
Globulin: 2.2 g/dL (calc) (ref 1.9–3.7)
Glucose, Bld: 89 mg/dL (ref 65–99)
Potassium: 4.5 mmol/L (ref 3.5–5.3)
Sodium: 137 mmol/L (ref 135–146)
Total Bilirubin: 0.4 mg/dL (ref 0.2–1.2)
Total Protein: 6.8 g/dL (ref 6.1–8.1)

## 2020-01-11 LAB — HEPATITIS B SURFACE ANTIBODY,QUALITATIVE: Hep B S Ab: REACTIVE — AB

## 2020-01-11 LAB — HIV ANTIBODY (ROUTINE TESTING W REFLEX): HIV 1&2 Ab, 4th Generation: NONREACTIVE

## 2020-01-11 LAB — HEPATITIS B SURFACE ANTIGEN: Hepatitis B Surface Ag: NONREACTIVE

## 2020-02-13 ENCOUNTER — Telehealth: Payer: Self-pay | Admitting: *Deleted

## 2020-02-13 ENCOUNTER — Ambulatory Visit: Payer: Self-pay | Admitting: Pharmacist

## 2020-02-13 NOTE — Telephone Encounter (Signed)
Patient called to cancel today's appointment. He got overheated at work, is throwing up and dizzy. His father is taking him for evaluation. He will come 6/16 instead. Andree Coss, RN

## 2020-02-15 ENCOUNTER — Ambulatory Visit (INDEPENDENT_AMBULATORY_CARE_PROVIDER_SITE_OTHER): Payer: Self-pay | Admitting: Pharmacist

## 2020-02-15 ENCOUNTER — Other Ambulatory Visit: Payer: Self-pay

## 2020-02-15 DIAGNOSIS — B182 Chronic viral hepatitis C: Secondary | ICD-10-CM

## 2020-02-15 NOTE — Progress Notes (Addendum)
I have reviewed the Infectious Disease Clinical Pharmacist's note above. I agree with the assessment and plan as outlined in Cassie Kuppelweiser's note.   HPI: TACUMA GRAFFAM is a 29 y.o. male who presents to the [place] clinic for Hepatitis C follow-up.  Medication: Mavyrt  Start Date: 12/10/19  Hepatitis C Genotype: 1a  Fibrosis Score: F0  Hepatitis C RNA: 761,000 on 10/03/2019  Patient Active Problem List   Diagnosis Date Noted  . Essential hypertension 10/03/2019  . Chronic pain syndrome 06/19/2017  . Opioid dependence in remission ([place]) 06/19/2017    Patient's Medications  New Prescriptions   No medications on file  Previous Medications   GLECAPREVIR-PIBRENTASVIR (MAVYRET) 100-40 MG TABS    Take 3 tablets by mouth daily with breakfast.   LISINOPRIL-HYDROCHLOROTHIAZIDE (ZESTORETIC) 20-12.5 MG TABLET    Take 1 tablet by mouth daily.   METHADONE (DOLOPHINE) 1 MG/ML ORAL SOLUTION    Take 120 mg/kg by mouth daily.  Modified Medications   No medications on file  Discontinued Medications   No medications on file    Allergies: No Known Allergies  Past Medical History: Past Medical History:  Diagnosis Date  . Drug abuse ([place])   . Drug addiction in remission ([place])   . Hypertension     Social History: Social History   Socioeconomic History  . Marital status: Single    Spouse name: Not on file  . Number of children: Not on file  . Years of education: Not on file  . Highest education level: Not on file  Occupational History  . Not on file  Tobacco Use  . Smoking status: Current Every Day Smoker    Packs/day: 0.50    Years: 12.00    Pack years: 6.00    Types: Cigarettes  . Smokeless tobacco: Former Network engineer  . Vaping Use: Never used  Substance and Sexual Activity  . Alcohol use: Never  . Drug use: Yes    Types: Marijuana, Heroin, Oxycodone    Comment: prescribed methadone  . Sexual activity: Yes  Other Topics Concern  . Not on file    Social History Narrative   Patient is on Methadone for substance abuse. [facility] [place], [place]. Patient Sober 4 years. Living with parents now.    Social Determinants of Health   Financial Resource Strain:   . Difficulty of Paying Living Expenses:   Food Insecurity:   . Worried About Charity fundraiser in the Last Year:   . Arboriculturist in the Last Year:   Transportation Needs:   . Film/video editor (Medical):   Marland Kitchen Lack of Transportation (Non-Medical):   Physical Activity:   . Days of Exercise per Week:   . Minutes of Exercise per Session:   Stress:   . Feeling of Stress :   Social Connections:   . Frequency of Communication with Friends and Family:   . Frequency of Social Gatherings with Friends and Family:   . Attends Religious Services:   . Active Member of Clubs or Organizations:   . Attends Archivist Meetings:   Marland Kitchen Marital Status:     Labs: Hepatitis C Lab Results  Component Value Date   HCVGENOTYPE Comment 10/03/2019   HCVRNAPCRQN <15 NOT DETECTED 01/09/2020   FIBROSTAGE F0 11/08/2019   Hepatitis B Lab Results  Component Value Date   HEPBSAB REACTIVE (A) 01/09/2020   HEPBSAG NON-REACTIVE 01/09/2020   Hepatitis A No  results found for: HAV HIV Lab Results  Component Value Date   HIV NON-REACTIVE 01/09/2020   Lab Results  Component Value Date   CREATININE 0.96 01/09/2020   CREATININE 1.02 10/03/2019   CREATININE 0.89 10/28/2012   CREATININE 1.02 11/04/2011   Lab Results  Component Value Date   AST 20 01/09/2020   AST 53 (H) 10/03/2019   AST 32 10/28/2012   ALT 19 01/09/2020   ALT 62 (H) 11/08/2019   ALT 80 (H) 10/03/2019   INR 1.0 11/08/2019    Assessment: Ian Malkin is here in clinic today after finishing treatment with Mavyret for his hepatitis C last Monday 6/7. He says he had no issues tolerating the medication and he would take all three tablets at once with dinner at night. There were a few times, once or twice he  says, where he would forget and wake up in the middle of the night to take the medication and a few times he would forget completely. This makes sense because he should have finished on the 5th.   He also complains of this burning in his feet that he has been unable to explain for the past 6 months. Unfortunately I could not help him come up with any reasons as to why this was happening. I recommended he follow up with his PCP here in [place] and maybe they could make a referral to a specialist to help him. We will see him back in 3 months for his cure visit.    Plan: - HCV RNA - F/u with Cassie 9/13 for SVR  Jettie Pagan, PharmD PGY2 Infectious Disease Pharmacy Resident  [facility] for Infectious Disease 02/15/2020, 4:06 PM

## 2020-02-17 LAB — HEPATITIS C RNA QUANTITATIVE
HCV Quantitative Log: <1.18 Log IU/mL
HCV RNA, PCR, QN: <15 IU/mL

## 2020-02-27 ENCOUNTER — Other Ambulatory Visit: Payer: Self-pay

## 2020-02-27 ENCOUNTER — Encounter: Payer: Self-pay | Admitting: Family Medicine

## 2020-02-27 ENCOUNTER — Ambulatory Visit (INDEPENDENT_AMBULATORY_CARE_PROVIDER_SITE_OTHER): Payer: Self-pay | Admitting: Family Medicine

## 2020-02-27 VITALS — BP 130/80 | HR 80 | Ht 72.0 in | Wt 213.0 lb

## 2020-02-27 DIAGNOSIS — Z0289 Encounter for other administrative examinations: Secondary | ICD-10-CM

## 2020-02-27 NOTE — Progress Notes (Signed)
Date:  02/27/2020   Name:  Christian Doyle   DOB:  Jan 17, 1991   MRN:  [id number]   Chief Complaint: Annual Exam  Patient is a 29 year old male who presents for a dept of transportation exam. The patient reports the following problems: none. Health maintenance has been reviewed up to date.   Lab Results  Component Value Date   CREATININE 0.96 01/09/2020   BUN 13 01/09/2020   NA 137 01/09/2020   K 4.5 01/09/2020   CL 100 01/09/2020   CO2 32 01/09/2020   Lab Results  Component Value Date   CHOL 175 10/03/2019   HDL 63 10/03/2019   LDLCALC 99 10/03/2019   TRIG 71 10/03/2019   Lab Results  Component Value Date   TSH 1.08 10/28/2012   No results found for: HGBA1C Lab Results  Component Value Date   WBC 7.7 11/08/2019   HGB 13.4 11/08/2019   HCT 40.4 11/08/2019   MCV 93.3 11/08/2019   PLT 176 11/08/2019   Lab Results  Component Value Date   ALT 19 01/09/2020   AST 20 01/09/2020   ALKPHOS 98 10/03/2019   BILITOT 0.4 01/09/2020     Review of Systems  Constitutional: Negative for chills and fever.  HENT: Negative for drooling, ear discharge, ear pain and sore throat.   Respiratory: Negative for cough, shortness of breath and wheezing.   Cardiovascular: Negative for chest pain, palpitations and leg swelling.  Gastrointestinal: Negative for abdominal pain, blood in stool, constipation, diarrhea and nausea.  Endocrine: Negative for polydipsia.  Genitourinary: Negative for dysuria, frequency, hematuria and urgency.  Musculoskeletal: Negative for back pain, myalgias and neck pain.  Skin: Negative for rash.  Allergic/Immunologic: Negative for environmental allergies.  Neurological: Negative for dizziness and headaches.  Hematological: Does not bruise/bleed easily.  Psychiatric/Behavioral: Negative for suicidal ideas. The patient is not nervous/anxious.     Patient Active Problem List   Diagnosis Date Noted  . Essential hypertension 10/03/2019  . Chronic pain  syndrome 06/19/2017  . Opioid dependence in remission ([place]) 06/19/2017    No Known Allergies  Past Surgical History:  Procedure Laterality Date  . TONSILLECTOMY      Social History   Tobacco Use  . Smoking status: Current Every Day Smoker    Packs/day: 0.50    Years: 12.00    Pack years: 6.00    Types: Cigarettes  . Smokeless tobacco: Former Network engineer  . Vaping Use: Never used  Substance Use Topics  . Alcohol use: Never  . Drug use: Yes    Types: Marijuana, Heroin, Oxycodone    Comment: prescribed methadone     Medication list has been reviewed and updated.  Current Meds  Medication Sig  . lisinopril-hydrochlorothiazide (ZESTORETIC) 20-12.5 MG tablet Take 1 tablet by mouth daily.  . methadone (DOLOPHINE) 1 mg/ml oral solution Take 120 mg/kg by mouth daily.    PHQ 2/9 Scores 02/27/2020 10/03/2019  PHQ - 2 Score 0 0  PHQ- 9 Score 0 0    GAD 7 : Generalized Anxiety Score 02/27/2020 10/03/2019  Nervous, Anxious, on Edge 0 0  Control/stop worrying 0 0  Worry too much - different things 0 0  Trouble relaxing 0 0  Restless 0 0  Easily annoyed or irritable 0 0  Afraid - awful might happen 0 0  Total GAD 7 Score 0 0    BP Readings from Last 3 Encounters:  02/27/20 130/80  10/27/19 117/74  10/03/19 120/78    Physical Exam Vitals and nursing note reviewed.  HENT:     Head: Normocephalic.     Right Ear: External ear normal.     Left Ear: External ear normal.     Nose: Nose normal.  Eyes:     General: No scleral icterus.       Right eye: No discharge.        Left eye: No discharge.     Conjunctiva/sclera: Conjunctivae normal.     Pupils: Pupils are equal, round, and reactive to light.  Neck:     Thyroid: No thyromegaly.     Vascular: No JVD.     Trachea: No tracheal deviation.  Cardiovascular:     Rate and Rhythm: Normal rate and regular rhythm.     Heart sounds: Normal heart sounds, S1 normal and S2 normal. No murmur heard.  No systolic murmur is  present.  No diastolic murmur is present.  No friction rub. No gallop. No S3 or S4 sounds.   Pulmonary:     Effort: No respiratory distress.     Breath sounds: Normal breath sounds. No decreased breath sounds, wheezing, rhonchi or rales.  Abdominal:     General: Bowel sounds are normal.     Palpations: Abdomen is soft. There is no mass.     Tenderness: There is no abdominal tenderness. There is no guarding or rebound.  Musculoskeletal:        General: No tenderness. Normal range of motion.     Cervical back: Normal range of motion and neck supple.  Lymphadenopathy:     Cervical: No cervical adenopathy.  Skin:    General: Skin is warm.     Findings: No rash.  Neurological:     Mental Status: He is alert and oriented to person, place, and time.     Cranial Nerves: No cranial nerve deficit.     Deep Tendon Reflexes: Reflexes are normal and symmetric.     Wt Readings from Last 3 Encounters:  02/27/20 213 lb (96.6 kg)  10/27/19 236 lb (107 kg)  10/03/19 233 lb (105.7 kg)    BP 130/80   Pulse 80   Ht 6' (1.829 m)   Wt 213 lb (96.6 kg)   BMI 28.89 kg/m   Assessment and Plan: 1. Encounter for examination required by Department of Transportation (DOT) Patient was seen today for medical aspect of the Department of Transportation exam to be filled out by his PCP with further evaluation by his a substance abuse counselor.

## 2020-03-28 ENCOUNTER — Other Ambulatory Visit: Payer: Self-pay | Admitting: Family Medicine

## 2020-03-28 DIAGNOSIS — I10 Essential (primary) hypertension: Secondary | ICD-10-CM

## 2020-05-14 ENCOUNTER — Ambulatory Visit (INDEPENDENT_AMBULATORY_CARE_PROVIDER_SITE_OTHER): Payer: Self-pay | Admitting: Pharmacist

## 2020-05-14 ENCOUNTER — Other Ambulatory Visit: Payer: Self-pay

## 2020-05-14 DIAGNOSIS — B182 Chronic viral hepatitis C: Secondary | ICD-10-CM

## 2020-05-15 NOTE — Progress Notes (Signed)
HPI: Christian Doyle is a 29 y.o. male who presents to the [facility] pharmacy clinic for Hepatitis C follow-up.  Medication: Mavyret  Start Date: 12/10/19  Hepatitis C Genotype: 1a  Fibrosis Score: F0  Hepatitis C RNA: 761,000 on 10/03/2019; <15 on 01/09/20 and 02/15/20  Patient Active Problem List   Diagnosis Date Noted  . Essential hypertension 10/03/2019  . Chronic pain syndrome 06/19/2017  . Opioid dependence in remission (HCC) 06/19/2017    Patient's Medications  New Prescriptions   No medications on file  Previous Medications   LISINOPRIL-HYDROCHLOROTHIAZIDE (ZESTORETIC) 20-12.5 MG TABLET    Take 1 tablet by mouth once daily   METHADONE (DOLOPHINE) 1 MG/ML ORAL SOLUTION    Take 120 mg/kg by mouth daily.  Modified Medications   No medications on file  Discontinued Medications   No medications on file    Allergies: No Known Allergies  Past Medical History: Past Medical History:  Diagnosis Date  . Drug abuse (HCC)   . Drug addiction in remission (HCC)   . Hypertension     Social History: Social History   Socioeconomic History  . Marital status: Single    Spouse name: Not on file  . Number of children: Not on file  . Years of education: Not on file  . Highest education level: Not on file  Occupational History  . Not on file  Tobacco Use  . Smoking status: Current Every Day Smoker    Packs/day: 0.50    Years: 12.00    Pack years: 6.00    Types: Cigarettes  . Smokeless tobacco: Former Clinical biochemist  . Vaping Use: Never used  Substance and Sexual Activity  . Alcohol use: Never  . Drug use: Yes    Types: Marijuana, Heroin, Oxycodone    Comment: prescribed methadone  . Sexual activity: Yes  Other Topics Concern  . Not on file  Social History Narrative   Patient is on Methadone for substance abuse. [facility] [place], [place]. Patient Sober 4 years. Living with parents now.    Social Determinants of Health   Financial Resource Strain:   .  Difficulty of Paying Living Expenses: Not on file  Food Insecurity:   . Worried About Programme researcher, broadcasting/film/video in the Last Year: Not on file  . Ran Out of Food in the Last Year: Not on file  Transportation Needs:   . Lack of Transportation (Medical): Not on file  . Lack of Transportation (Non-Medical): Not on file  Physical Activity:   . Days of Exercise per Week: Not on file  . Minutes of Exercise per Session: Not on file  Stress:   . Feeling of Stress : Not on file  Social Connections:   . Frequency of Communication with Friends and Family: Not on file  . Frequency of Social Gatherings with Friends and Family: Not on file  . Attends Religious Services: Not on file  . Active Member of Clubs or Organizations: Not on file  . Attends Banker Meetings: Not on file  . Marital Status: Not on file    Labs: Hepatitis C Lab Results  Component Value Date   HCVGENOTYPE Comment 10/03/2019   HCVRNAPCRQN <15 NOT DETECTED 02/15/2020   HCVRNAPCRQN <15 NOT DETECTED 01/09/2020   FIBROSTAGE F0 11/08/2019   Hepatitis B Lab Results  Component Value Date   HEPBSAB REACTIVE (A) 01/09/2020   HEPBSAG NON-REACTIVE 01/09/2020   Hepatitis A No results found for: HAV HIV  Lab Results  Component Value Date   HIV NON-REACTIVE 01/09/2020   Lab Results  Component Value Date   CREATININE 0.96 01/09/2020   CREATININE 1.02 10/03/2019   CREATININE 0.89 10/28/2012   CREATININE 1.02 11/04/2011   Lab Results  Component Value Date   AST 20 01/09/2020   AST 53 (H) 10/03/2019   AST 32 10/28/2012   ALT 19 01/09/2020   ALT 62 (H) 11/08/2019   ALT 80 (H) 10/03/2019   INR 1.0 11/08/2019    Assessment: Christian Doyle is here today for his Hepatitis C cure visit. He completed 8 week of Mavyret back in June. He had no issues taking it. Reminded him that his Hepatitis C antibody will remain positive lifelong and to not engage in risky behavior as he can become infected again once he is cured. Answered all  questions.    Plan: - Hepatitis C RNA for cure  Christian Doyle L. Rony Ratz, PharmD, BCIDP, AAHIVP, CPP Clinical Pharmacist Practitioner Infectious Diseases Clinical Pharmacist [facility] 05/15/2020, 11:17 AM

## 2020-05-18 LAB — HEPATITIS C RNA QUANTITATIVE
HCV RNA, PCR, QN (Log): <1.18 log IU/mL
HCV RNA, PCR, QN: <15 IU/mL

## 2020-05-19 ENCOUNTER — Encounter: Payer: Self-pay | Admitting: Pharmacist

## 2020-06-25 ENCOUNTER — Other Ambulatory Visit: Payer: Self-pay | Admitting: Family Medicine

## 2020-06-25 DIAGNOSIS — I10 Essential (primary) hypertension: Secondary | ICD-10-CM

## 2020-09-22 ENCOUNTER — Other Ambulatory Visit: Payer: Self-pay | Admitting: Family Medicine

## 2020-09-22 DIAGNOSIS — I10 Essential (primary) hypertension: Secondary | ICD-10-CM

## 2020-09-22 NOTE — Telephone Encounter (Signed)
Courtesy refill given, appointment needed. 

## 2020-09-25 ENCOUNTER — Other Ambulatory Visit: Payer: Self-pay | Admitting: Family Medicine

## 2020-09-25 DIAGNOSIS — I10 Essential (primary) hypertension: Secondary | ICD-10-CM

## 2020-10-26 ENCOUNTER — Ambulatory Visit: Payer: Self-pay | Admitting: Family Medicine

## 2020-10-26 ENCOUNTER — Encounter: Payer: Self-pay | Admitting: Family Medicine

## 2020-10-26 ENCOUNTER — Ambulatory Visit (INDEPENDENT_AMBULATORY_CARE_PROVIDER_SITE_OTHER): Payer: Self-pay | Admitting: Family Medicine

## 2020-10-26 ENCOUNTER — Other Ambulatory Visit: Payer: Self-pay

## 2020-10-26 ENCOUNTER — Other Ambulatory Visit
Admission: RE | Admit: 2020-10-26 | Discharge: 2020-10-26 | Disposition: A | Discharge status: Home / Self Care | Payer: Self-pay | Attending: Family Medicine | Admitting: Family Medicine

## 2020-10-26 VITALS — BP 138/70 | HR 80 | Ht 75.0 in | Wt 212.0 lb

## 2020-10-26 DIAGNOSIS — I10 Essential (primary) hypertension: Secondary | ICD-10-CM

## 2020-10-26 DIAGNOSIS — F419 Anxiety disorder, unspecified: Secondary | ICD-10-CM

## 2020-10-26 DIAGNOSIS — F32A Depression, unspecified: Secondary | ICD-10-CM

## 2020-10-26 LAB — RENAL FUNCTION PANEL
Albumin: 4.4 g/dL (ref 3.5–5.0)
Anion gap: 12 (ref 5–15)
BUN: 12 mg/dL (ref 6–20)
CO2: 27 mmol/L (ref 22–32)
Calcium: 9.5 mg/dL (ref 8.9–10.3)
Chloride: 97 mmol/L — ABNORMAL LOW (ref 98–111)
Creatinine, Ser: 0.98 mg/dL (ref 0.61–1.24)
GFR, Estimated: >60 mL/min (ref >60)
Glucose, Bld: 105 mg/dL — ABNORMAL HIGH (ref 70–99)
Phosphorus: 2.5 mg/dL (ref 2.5–4.6)
Potassium: 4.2 mmol/L (ref 3.5–5.1)
Sodium: 136 mmol/L (ref 135–145)

## 2020-10-26 MED ORDER — LISINOPRIL-HYDROCHLOROTHIAZIDE 20-12.5 MG PO TABS: 1.0000 | ORAL_TABLET | Freq: Every day | ORAL | 1 refills | Status: DC

## 2020-10-26 NOTE — Progress Notes (Signed)
Date:  10/26/2020   Name:  Christian Doyle   DOB:  1991/05/01   MRN:  [id number]   Chief Complaint: Hypertension and Depression (14 and 10)  Hypertension This is a chronic problem. The current episode started more than 1 year ago. The problem has been gradually improving since onset. The problem is controlled. Pertinent negatives include no anxiety, blurred vision, chest pain, headaches, malaise/fatigue, neck pain, orthopnea, palpitations, peripheral edema, PND, shortness of breath or sweats. There are no associated agents to hypertension. There are no known risk factors for coronary artery disease. Past treatments include ACE inhibitors and diuretics. The current treatment provides moderate improvement. There are no compliance problems.  There is no history of angina, kidney disease, CAD/MI, CVA, heart failure, left ventricular hypertrophy, PVD or retinopathy. There is no history of chronic renal disease, a hypertension causing med or renovascular disease.  Depression        This is a chronic problem.  The current episode started more than 1 year ago.   The problem occurs intermittently.  Associated symptoms include decreased concentration, helplessness, hopelessness, irritable, restlessness, decreased interest, appetite change and sad.  Associated symptoms include no myalgias, no headaches and no suicidal ideas.   Pertinent negatives include no anxiety.   Lab Results  Component Value Date   CREATININE 0.96 01/09/2020   BUN 13 01/09/2020   NA 137 01/09/2020   K 4.5 01/09/2020   CL 100 01/09/2020   CO2 32 01/09/2020   Lab Results  Component Value Date   CHOL 175 10/03/2019   HDL 63 10/03/2019   LDLCALC 99 10/03/2019   TRIG 71 10/03/2019   Lab Results  Component Value Date   TSH 1.08 10/28/2012   No results found for: HGBA1C Lab Results  Component Value Date   WBC 7.7 11/08/2019   HGB 13.4 11/08/2019   HCT 40.4 11/08/2019   MCV 93.3 11/08/2019   PLT 176 11/08/2019   Lab  Results  Component Value Date   ALT 19 01/09/2020   AST 20 01/09/2020   GGT 137 (H) 11/08/2019   ALKPHOS 98 10/03/2019   BILITOT 0.4 01/09/2020     Review of Systems  Constitutional: Positive for appetite change. Negative for chills, fever and malaise/fatigue.  HENT: Negative for drooling, ear discharge, ear pain and sore throat.   Eyes: Negative for blurred vision.  Respiratory: Negative for cough, shortness of breath and wheezing.   Cardiovascular: Negative for chest pain, palpitations, orthopnea, leg swelling and PND.  Gastrointestinal: Negative for abdominal pain, blood in stool, constipation, diarrhea and nausea.  Endocrine: Negative for polydipsia.  Genitourinary: Negative for dysuria, frequency, hematuria and urgency.  Musculoskeletal: Negative for back pain, myalgias and neck pain.  Skin: Negative for rash.  Allergic/Immunologic: Negative for environmental allergies.  Neurological: Negative for dizziness and headaches.  Hematological: Does not bruise/bleed easily.  Psychiatric/Behavioral: Positive for decreased concentration and depression. Negative for suicidal ideas. The patient is not nervous/anxious.     Patient Active Problem List   Diagnosis Date Noted  . Essential hypertension 10/03/2019  . Chronic pain syndrome 06/19/2017  . Opioid dependence in remission (HCC) 06/19/2017    No Known Allergies  Past Surgical History:  Procedure Laterality Date  . TONSILLECTOMY      Social History   Tobacco Use  . Smoking status: Current Every Day Smoker    Packs/day: 0.50    Years: 12.00    Pack years: 6.00    Types: Cigarettes  . Smokeless  tobacco: Former Clinical biochemist  . Vaping Use: Never used  Substance Use Topics  . Alcohol use: Never  . Drug use: Yes    Types: Marijuana, Heroin, Oxycodone    Comment: prescribed methadone     Medication list has been reviewed and updated.  Current Meds  Medication Sig  . lisinopril-hydrochlorothiazide (ZESTORETIC)  20-12.5 MG tablet Take 1 tablet by mouth daily. OFFICE VISIT NEEDED FOR ADDITIONAL REFILLS  . methadone (DOLOPHINE) 1 mg/ml oral solution Take 120 mg/kg by mouth daily.    PHQ 2/9 Scores 10/26/2020 02/27/2020 10/03/2019  PHQ - 2 Score 4 0 0  PHQ- 9 Score 14 0 0    GAD 7 : Generalized Anxiety Score 10/26/2020 02/27/2020 10/03/2019  Nervous, Anxious, on Edge 2 0 0  Control/stop worrying 2 0 0  Worry too much - different things 2 0 0  Trouble relaxing 1 0 0  Restless 1 0 0  Easily annoyed or irritable 1 0 0  Afraid - awful might happen 1 0 0  Total GAD 7 Score 10 0 0  Anxiety Difficulty Somewhat difficult - -    BP Readings from Last 3 Encounters:  10/26/20 138/70  02/27/20 130/80  10/27/19 117/74    Physical Exam Vitals and nursing note reviewed.  Constitutional:      General: He is irritable.  HENT:     Head: Normocephalic.     Right Ear: Tympanic membrane and external ear normal.     Left Ear: Tympanic membrane and external ear normal.     Nose: Nose normal.     Mouth/Throat:     Mouth: Oropharynx is clear and moist.  Eyes:     General: No scleral icterus.       Right eye: No discharge.        Left eye: No discharge.     Extraocular Movements: EOM normal.     Conjunctiva/sclera: Conjunctivae normal.     Pupils: Pupils are equal, round, and reactive to light.  Neck:     Thyroid: No thyromegaly.     Vascular: No JVD.     Trachea: No tracheal deviation.  Cardiovascular:     Rate and Rhythm: Normal rate and regular rhythm.     Pulses: Intact distal pulses.     Heart sounds: Normal heart sounds. No murmur heard. No friction rub. No gallop.   Pulmonary:     Effort: No respiratory distress.     Breath sounds: Normal breath sounds. No wheezing or rales.  Abdominal:     General: Bowel sounds are normal.     Palpations: Abdomen is soft. There is no hepatosplenomegaly or mass.     Tenderness: There is no abdominal tenderness. There is no CVA tenderness, guarding or rebound.   Musculoskeletal:        General: No tenderness or edema. Normal range of motion.     Cervical back: Normal range of motion and neck supple.  Lymphadenopathy:     Cervical: No cervical adenopathy.  Skin:    General: Skin is warm.     Findings: No rash.  Neurological:     Mental Status: He is alert and oriented to person, place, and time.     Cranial Nerves: No cranial nerve deficit.     Deep Tendon Reflexes: Strength normal and reflexes are normal and symmetric.     Wt Readings from Last 3 Encounters:  10/26/20 212 lb (96.2 kg)  02/27/20 213 lb (96.6 kg)  10/27/19  236 lb (107 kg)    BP 138/70   Pulse 80   Ht 6\' 3"  (1.905 m)   Wt 212 lb (96.2 kg)   BMI 26.50 kg/m   Assessment and Plan: 1. Essential hypertension Chronic.  Controlled.  Stable.  Blood pressure today is 138/70.  We will continue lisinopril hydrochlorothiazide 20-12.5 mg once a day.  Will check renal function panel for current electrolyte and GFR status. - Renal Function Panel - lisinopril-hydrochlorothiazide (ZESTORETIC) 20-12.5 MG tablet; Take 1 tablet by mouth daily.  Dispense: 90 tablet; Refill: 1  2. Anxiety and depression Chronic.  Persistent.  Relatively stable given the circumstances of the patient.  PHQ was 14 Gad score is 10.  We have given him information to pursue psychiatry help and patient will investigate this given his self-pay status.

## 2021-01-04 ENCOUNTER — Other Ambulatory Visit: Payer: Self-pay

## 2021-01-04 ENCOUNTER — Ambulatory Visit
Admission: RE | Admit: 2021-01-04 | Discharge: 2021-01-04 | Disposition: A | Discharge status: Home / Self Care | Payer: Self-pay | Attending: Family Medicine | Admitting: Family Medicine

## 2021-01-04 ENCOUNTER — Ambulatory Visit (INDEPENDENT_AMBULATORY_CARE_PROVIDER_SITE_OTHER): Payer: Self-pay | Admitting: Family Medicine

## 2021-01-04 ENCOUNTER — Encounter: Payer: Self-pay | Admitting: Family Medicine

## 2021-01-04 ENCOUNTER — Ambulatory Visit
Admission: RE | Admit: 2021-01-04 | Discharge: 2021-01-04 | Disposition: A | Discharge status: Home / Self Care | Payer: Self-pay | Source: Ambulatory Visit | Attending: Family Medicine | Admitting: Family Medicine

## 2021-01-04 VITALS — BP 138/80 | HR 88 | Ht 75.0 in | Wt 209.0 lb

## 2021-01-04 DIAGNOSIS — M999 Biomechanical lesion, unspecified: Secondary | ICD-10-CM
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
## 2021-01-07 ENCOUNTER — Telehealth: Payer: Self-pay | Admitting: Family Medicine

## 2021-01-07 NOTE — Telephone Encounter (Signed)
Patient returning call regarding results   Copied from CRM [id number]. Topic: Quick Communication - Lab Results (Clinic Use ONLY) >> Jan 07, 2021  8:05 AM Everitt Amber wrote: Called patient to inform them of xray results. When patient returns call, triage nurse may disclose results.

## 2021-01-07 NOTE — Telephone Encounter (Signed)
Attempted to reach patient regarding X-ray results- no answer- left message to call office for results. (Results are in result que) No visible bony mass no adenopathy

## 2021-04-25 ENCOUNTER — Other Ambulatory Visit: Payer: Self-pay

## 2021-04-25 ENCOUNTER — Ambulatory Visit (INDEPENDENT_AMBULATORY_CARE_PROVIDER_SITE_OTHER): Payer: Self-pay | Admitting: Family Medicine

## 2021-04-25 ENCOUNTER — Encounter: Payer: Self-pay | Admitting: Family Medicine

## 2021-04-25 VITALS — BP 120/70 | HR 76 | Ht 75.0 in | Wt 203.0 lb

## 2021-04-25 DIAGNOSIS — I10 Essential (primary) hypertension: Secondary | ICD-10-CM

## 2021-04-25 MED ORDER — LISINOPRIL-HYDROCHLOROTHIAZIDE 20-12.5 MG PO TABS: 1.0000 | ORAL_TABLET | Freq: Every day | ORAL | 1 refills | Status: DC

## 2021-04-25 NOTE — Progress Notes (Signed)
Date:  04/25/2021   Name:  Christian Doyle   DOB:  [birth year]   MRN:  [id number]   Chief Complaint: Hypertension  Hypertension This is a chronic problem. The current episode started more than 1 month ago. The problem has been gradually improving since onset. The problem is controlled. Pertinent negatives include no anxiety, blurred vision, chest pain, headaches, malaise/fatigue, neck pain, orthopnea, palpitations, peripheral edema, PND, shortness of breath or sweats. There are no associated agents to hypertension. There are no known risk factors for coronary artery disease. Past treatments include ACE inhibitors and diuretics. The current treatment provides moderate improvement. There are no compliance problems.  There is no history of angina, kidney disease, CAD/MI, CVA, heart failure, left ventricular hypertrophy, PVD or retinopathy. There is no history of chronic renal disease, a hypertension causing med or renovascular disease.   Lab Results  Component Value Date   CREATININE 0.98 10/26/2020   BUN 12 10/26/2020   NA 136 10/26/2020   K 4.2 10/26/2020   CL 97 (L) 10/26/2020   CO2 27 10/26/2020   Lab Results  Component Value Date   CHOL 175 10/03/2019   HDL 63 10/03/2019   LDLCALC 99 10/03/2019   TRIG 71 10/03/2019   Lab Results  Component Value Date   TSH 1.08 10/28/2012   No results found for: HGBA1C Lab Results  Component Value Date   WBC 7.7 11/08/2019   HGB 13.4 11/08/2019   HCT 40.4 11/08/2019   MCV 93.3 11/08/2019   PLT 176 11/08/2019   Lab Results  Component Value Date   ALT 19 01/09/2020   AST 20 01/09/2020   GGT 137 (H) 11/08/2019   ALKPHOS 98 10/03/2019   BILITOT 0.4 01/09/2020     Review of Systems  Constitutional:  Negative for chills, fever and malaise/fatigue.  HENT:  Negative for drooling, ear discharge, ear pain and sore throat.   Eyes:  Negative for blurred vision.  Respiratory:  Negative for cough, shortness of breath and wheezing.    Cardiovascular:  Negative for chest pain, palpitations, orthopnea, leg swelling and PND.  Gastrointestinal:  Negative for abdominal pain, blood in stool, constipation, diarrhea and nausea.  Endocrine: Negative for polydipsia.  Genitourinary:  Negative for dysuria, frequency, hematuria and urgency.  Musculoskeletal:  Negative for back pain, myalgias and neck pain.  Skin:  Negative for rash.  Allergic/Immunologic: Negative for environmental allergies.  Neurological:  Negative for dizziness and headaches.  Hematological:  Does not bruise/bleed easily.  Psychiatric/Behavioral:  Negative for suicidal ideas. The patient is not nervous/anxious.   All other systems reviewed and are negative.  Patient Active Problem List   Diagnosis Date Noted   Essential hypertension 10/03/2019   Chronic pain syndrome 06/19/2017   Opioid dependence in remission (HCC) 06/19/2017    No Known Allergies  Past Surgical History:  Procedure Laterality Date   TONSILLECTOMY      Social History   Tobacco Use   Smoking status: Every Day    Packs/day: 0.50    Years: 12.00    Pack years: 6.00    Types: Cigarettes   Smokeless tobacco: Former  Building services engineer Use: Never used  Substance Use Topics   Alcohol use: Never   Drug use: Yes    Types: Marijuana, Heroin, Oxycodone    Comment: prescribed methadone     Medication list has been reviewed and updated.  Current Meds  Medication Sig   lisinopril-hydrochlorothiazide (ZESTORETIC) 20-12.5 MG tablet Take  1 tablet by mouth daily.   methadone (DOLOPHINE) 1 mg/ml oral solution Take 120 mg/kg by mouth daily.    PHQ 2/9 Scores 04/25/2021 01/04/2021 10/26/2020 02/27/2020  PHQ - 2 Score 1 0 4 0  PHQ- 9 Score 1 0 14 0    GAD 7 : Generalized Anxiety Score 04/25/2021 01/04/2021 10/26/2020 02/27/2020  Nervous, Anxious, on Edge 1 2 2  0  Control/stop worrying 1 1 2  0  Worry too much - different things 0 1 2 0  Trouble relaxing 0 0 1 0  Restless 1 0 1 0  Easily  annoyed or irritable 1 0 1 0  Afraid - awful might happen 0 0 1 0  Total GAD 7 Score 4 4 10  0  Anxiety Difficulty Somewhat difficult Not difficult at all Somewhat difficult -    BP Readings from Last 3 Encounters:  04/25/21 120/70  01/04/21 138/80  10/26/20 138/70    Physical Exam Vitals and nursing note reviewed.  HENT:     Head: Normocephalic.     Right Ear: Tympanic membrane, ear canal and external ear normal.     Left Ear: Tympanic membrane, ear canal and external ear normal.     Nose: Nose normal. No congestion or rhinorrhea.  Eyes:     General: No scleral icterus.       Right eye: No discharge.        Left eye: No discharge.     Conjunctiva/sclera: Conjunctivae normal.     Pupils: Pupils are equal, round, and reactive to light.  Neck:     Thyroid: No thyromegaly.     Vascular: No JVD.     Trachea: No tracheal deviation.  Cardiovascular:     Rate and Rhythm: Normal rate and regular rhythm.     Heart sounds: Normal heart sounds. No murmur heard.   No friction rub. No gallop.  Pulmonary:     Effort: No respiratory distress.     Breath sounds: Normal breath sounds. No wheezing, rhonchi or rales.  Abdominal:     General: Bowel sounds are normal.     Palpations: Abdomen is soft. There is no mass.     Tenderness: There is no abdominal tenderness. There is no guarding or rebound.  Musculoskeletal:        General: No tenderness. Normal range of motion.     Cervical back: Normal range of motion and neck supple.  Lymphadenopathy:     Cervical: No cervical adenopathy.  Skin:    General: Skin is warm.     Findings: No rash.  Neurological:     Mental Status: He is alert and oriented to person, place, and time.     Cranial Nerves: No cranial nerve deficit.     Deep Tendon Reflexes: Reflexes are normal and symmetric.    Wt Readings from Last 3 Encounters:  04/25/21 203 lb (92.1 kg)  01/04/21 209 lb (94.8 kg)  10/26/20 212 lb (96.2 kg)    BP 120/70   Pulse 76   Ht  6\' 3"  (1.905 m)   Wt 203 lb (92.1 kg)   BMI 25.37 kg/m   Assessment and Plan:  1. Essential hypertension Chronic.  Controlled.  Stable.  Blood pressure 120/70.  Continue lisinopril-hydrochlorothiazide 20-12.5 mg daily.  We will recheck in 6 months - lisinopril-hydrochlorothiazide (ZESTORETIC) 20-12.5 MG tablet; Take 1 tablet by mouth daily.  Dispense: 90 tablet; Refill: 1

## 2021-09-02 ENCOUNTER — Emergency Department: Payer: Self-pay

## 2021-09-02 DIAGNOSIS — F17200 Nicotine dependence, unspecified, uncomplicated: Secondary | ICD-10-CM | POA: Insufficient documentation

## 2021-09-02 DIAGNOSIS — R064 Hyperventilation: Secondary | ICD-10-CM | POA: Insufficient documentation

## 2021-09-02 DIAGNOSIS — Z5321 Procedure and treatment not carried out due to patient leaving prior to being seen by health care provider: Secondary | ICD-10-CM | POA: Insufficient documentation

## 2021-09-02 DIAGNOSIS — G40909 Epilepsy, unspecified, not intractable, without status epilepticus: Secondary | ICD-10-CM | POA: Insufficient documentation

## 2021-09-02 LAB — CBC WITH DIFFERENTIAL/PLATELET
Abs Immature Granulocytes: 0.06 10*3/uL (ref 0.00–0.07)
Basophils Absolute: 0 10*3/uL (ref 0.0–0.1)
Basophils Relative: 0 %
Eosinophils Absolute: 0 10*3/uL (ref 0.0–0.5)
Eosinophils Relative: 0 %
HCT: 39.8 % (ref 39.0–52.0)
Hemoglobin: 13.5 g/dL (ref 13.0–17.0)
Immature Granulocytes: 1 %
Lymphocytes Relative: 8 %
Lymphs Abs: 0.9 10*3/uL (ref 0.7–4.0)
MCH: 30.9 pg (ref 26.0–34.0)
MCHC: 33.9 g/dL (ref 30.0–36.0)
MCV: 91.1 fL (ref 80.0–100.0)
Monocytes Absolute: 0.6 10*3/uL (ref 0.1–1.0)
Monocytes Relative: 5 %
Neutro Abs: 8.9 10*3/uL — ABNORMAL HIGH (ref 1.7–7.7)
Neutrophils Relative %: 86 %
Platelets: 185 10*3/uL (ref 150–400)
RBC: 4.37 MIL/uL (ref 4.22–5.81)
RDW: 13.7 % (ref 11.5–15.5)
WBC: 10.4 10*3/uL (ref 4.0–10.5)
nRBC: 0 % (ref 0.0–0.2)

## 2021-09-02 LAB — COMPREHENSIVE METABOLIC PANEL
ALT: 41 U/L (ref 0–44)
AST: 39 U/L (ref 15–41)
Albumin: 4.3 g/dL (ref 3.5–5.0)
Alkaline Phosphatase: 58 U/L (ref 38–126)
Anion gap: 9 (ref 5–15)
BUN: 18 mg/dL (ref 6–20)
CO2: 25 mmol/L (ref 22–32)
Calcium: 9.8 mg/dL (ref 8.9–10.3)
Chloride: 99 mmol/L (ref 98–111)
Creatinine, Ser: 0.97 mg/dL (ref 0.61–1.24)
GFR, Estimated: >60 mL/min (ref >60)
Glucose, Bld: 120 mg/dL — ABNORMAL HIGH (ref 70–99)
Potassium: 4.4 mmol/L (ref 3.5–5.1)
Sodium: 133 mmol/L — ABNORMAL LOW (ref 135–145)
Total Bilirubin: 0.5 mg/dL (ref 0.3–1.2)
Total Protein: 7.6 g/dL (ref 6.5–8.1)

## 2021-09-02 NOTE — ED Provider Notes (Signed)
°  Emergency Medicine Provider Triage Evaluation Note  Christian Doyle , a 31 y.o.male,  was evaluated in triage.  Pt complains of seizure.  Patient states that he was breaking down boxes at work when he suddenly started hyperventilating, got stiff, and face started twitching.  He then fell to the ground and began foaming from the mouth.  Patient denies any history of seizures.  Patient stated that he did smoke weed 2 hours prior to the event and took Xanax on Saturday.  He was mildly combative immediately after the event, but is now back to baseline.  Review of Systems  Positive: Seizure Negative: Denies fever, chest pain, vomiting  Physical Exam   Vitals:   09/02/21 1604  BP: 114/73  Pulse: 94  Resp: 20  Temp: 98.1 F (36.7 C)  SpO2: 100%   Gen:   Awake, no distress   Resp:  Normal effort  MSK:   Moves extremities without difficulty  Other:    Medical Decision Making  Given the patient's initial medical screening exam, the following diagnostic evaluation has been ordered. The patient will be placed in the appropriate treatment space, once one is available, to complete the evaluation and treatment. I have discussed the plan of care with the patient and I have advised the patient that an ED physician or mid-level practitioner will reevaluate their condition after the test results have been received, as the results may give them additional insight into the type of treatment they may need.    Diagnostics: Labs, EKG, head/neck CT, urine drug screen  Treatments: none immediately   Teodoro Spray, PA 09/02/21 1611    Duffy Bruce, MD 09/02/21 1719

## 2021-09-02 NOTE — ED Triage Notes (Signed)
Pt comes into the Ed via EMS from home, outside, reported family says they saw his through the window stiffening up and falling to the ground, pt is on 120mg  of methadone, smoked cannabis from a new person 2 hrs ago. Denies seizure hx  107/56 HR68 CBG159 94%RA Temp 98.8 #18gLAC

## 2021-09-02 NOTE — ED Triage Notes (Signed)
Pt via EMS from home. Per family, all of sudden started hyperventilating, got very stiff, face started twitching, and then fell to ground. Pt is on methadone. Pt denies hx of seizure. Pt did smoke weed with new person 2 hours ago, and took Xanax Saturday. Brother said after he got a little combative.   Mother is requesting drug screen, pt verbalized consent. Pt is A&Ox4   EMS has 20G L AC

## 2021-09-03 ENCOUNTER — Emergency Department
Admission: EM | Admit: 2021-09-03 | Discharge: 2021-09-03 | Disposition: A | Discharge status: Home / Self Care | Payer: Self-pay | Attending: Emergency Medicine | Admitting: Emergency Medicine

## 2021-09-05 ENCOUNTER — Encounter: Payer: Self-pay | Admitting: Family Medicine

## 2021-09-05 ENCOUNTER — Ambulatory Visit (INDEPENDENT_AMBULATORY_CARE_PROVIDER_SITE_OTHER): Payer: Self-pay | Admitting: Family Medicine

## 2021-09-05 ENCOUNTER — Other Ambulatory Visit: Payer: Self-pay

## 2021-09-05 VITALS — BP 120/70 | HR 80 | Ht 75.0 in | Wt 217.0 lb

## 2021-09-05 DIAGNOSIS — R569 Unspecified convulsions: Secondary | ICD-10-CM

## 2021-09-05 NOTE — Progress Notes (Signed)
Date:  09/05/2021   Name:  Christian Doyle   DOB:  12-Jul-1991   MRN:  [id number]   Chief Complaint: Seizures (Standing up, face started twitching and made loud noise like he was hurting. He fell down and arms went straight out and started foaming and spitting and shaking. Heart rate/ breathing was heavy and fast. Appeared to calm down and stopped shaking, but breathing was low. Eyes were fixed/ dazed and confused. Episode lasted approx. 5-10 minutes. )  Seizures  This is a new problem. The current episode started more than 2 days ago (3 days ago). Progression since onset: no further seizure activity. Pertinent negatives include no sleepiness, no confusion, no headaches, no visual disturbance, no neck stiffness, no sore throat, no chest pain, no cough, no nausea, no vomiting, no diarrhea and no muscle weakness. Characteristics include rhythmic jerking, loss of consciousness and bit tongue. Characteristics do not include bowel incontinence or bladder incontinence. The episode was Not witnessed. Possible causes do not include medication or dosage change, sleep deprivation or change in alcohol use. There has been no fever. Associated symptoms comments: "Twitch"/left facial.   Lab Results  Component Value Date   NA 133 (L) 09/02/2021   K 4.4 09/02/2021   CO2 25 09/02/2021   GLUCOSE 120 (H) 09/02/2021   BUN 18 09/02/2021   CREATININE 0.97 09/02/2021   CALCIUM 9.8 09/02/2021   GFRNONAA >60 09/02/2021   Lab Results  Component Value Date   CHOL 175 10/03/2019   HDL 63 10/03/2019   LDLCALC 99 10/03/2019   TRIG 71 10/03/2019   Lab Results  Component Value Date   TSH 1.08 10/28/2012   No results found for: HGBA1C Lab Results  Component Value Date   WBC 10.4 09/02/2021   HGB 13.5 09/02/2021   HCT 39.8 09/02/2021   MCV 91.1 09/02/2021   PLT 185 09/02/2021   Lab Results  Component Value Date   ALT 41 09/02/2021   AST 39 09/02/2021   GGT 137 (H) 11/08/2019   ALKPHOS 58 09/02/2021    BILITOT 0.5 09/02/2021   No results found for: 25OHVITD2, 25OHVITD3, VD25OH   Review of Systems  Constitutional:  Negative for chills and fever.  HENT:  Negative for drooling, ear discharge, ear pain and sore throat.   Eyes:  Negative for visual disturbance.  Respiratory:  Negative for cough, shortness of breath and wheezing.   Cardiovascular:  Negative for chest pain, palpitations and leg swelling.  Gastrointestinal:  Negative for abdominal pain, blood in stool, bowel incontinence, constipation, diarrhea, nausea and vomiting.  Endocrine: Negative for polydipsia.  Genitourinary:  Negative for bladder incontinence, dysuria, frequency, hematuria and urgency.  Musculoskeletal:  Negative for back pain, myalgias and neck pain.  Skin:  Negative for rash.  Allergic/Immunologic: Negative for environmental allergies.  Neurological:  Positive for seizures and loss of consciousness. Negative for dizziness and headaches.  Hematological:  Does not bruise/bleed easily.  Psychiatric/Behavioral:  Negative for confusion and suicidal ideas. The patient is not nervous/anxious.    Patient Active Problem List   Diagnosis Date Noted   Essential hypertension 10/03/2019   Chronic pain syndrome 06/19/2017   Opioid dependence in remission ([place]) 06/19/2017    No Known Allergies  Past Surgical History:  Procedure Laterality Date   TONSILLECTOMY      Social History   Tobacco Use   Smoking status: Every Day    Packs/day: 0.50    Years: 12.00    Pack years: 6.00  Types: Cigarettes   Smokeless tobacco: Former  Scientific laboratory technician Use: Never used  Substance Use Topics   Alcohol use: Never   Drug use: Yes    Types: Marijuana, Heroin, Oxycodone    Comment: prescribed methadone     Medication list has been reviewed and updated.  Current Meds  Medication Sig   lisinopril-hydrochlorothiazide (ZESTORETIC) 20-12.5 MG tablet Take 1 tablet by mouth daily.   methadone (DOLOPHINE) 1 mg/ml oral  solution Take 120 mg/kg by mouth daily.    PHQ 2/9 Scores 09/05/2021 04/25/2021 01/04/2021 10/26/2020  PHQ - 2 Score 5 1 0 4  PHQ- 9 Score 12 1 0 14    GAD 7 : Generalized Anxiety Score 09/05/2021 04/25/2021 01/04/2021 10/26/2020  Nervous, Anxious, on Edge 1 1 2 2   Control/stop worrying 0 1 1 2   Worry too much - different things 1 0 1 2  Trouble relaxing 1 0 0 1  Restless 0 1 0 1  Easily annoyed or irritable 0 1 0 1  Afraid - awful might happen 0 0 0 1  Total GAD 7 Score 3 4 4 10   Anxiety Difficulty Not difficult at all Somewhat difficult Not difficult at all Somewhat difficult    BP Readings from Last 3 Encounters:  09/05/21 120/70  09/02/21 (!) 135/59  04/25/21 120/70    Physical Exam Vitals and nursing note reviewed.    Wt Readings from Last 3 Encounters:  09/05/21 217 lb (98.4 kg)  04/25/21 203 lb (92.1 kg)  01/04/21 209 lb (94.8 kg)    BP 120/70    Pulse 80    Ht 6\' 3"  (1.905 m)    Wt 217 lb (98.4 kg)    BMI 27.12 kg/m   Assessment and Plan:  1. New onset seizure ([place]) New onset.  Patient had episode of a tonic-clonic seizure witnessed by his brother last week.  There has been no previous history of seizure disorder and there is been no suggestion of medication injury or other neurologic concerns that may precede this.  Patient does have a history of opiate dependency but this is in remission.  Patient also has history of chronic hepatitis C.  Review of lab work as well as CT scan at the emergency room is unremarkable neurologic exam was done today and it was unremarkable with no focality noted.  Cranial nerves were noted and that was unremarkable.  I explained the importance of further evaluation and that patient is not to operate motorized vehicle or heavy equipment or dangerous equipment for that matter until further evaluation.  Appointment has been made for neurologic evaluation at [facility] clinic and patient understands the importance of proceeding with further eval -  Ambulatory referral to Neurology

## 2021-09-12 ENCOUNTER — Other Ambulatory Visit: Payer: Self-pay | Admitting: Neurology

## 2021-09-12 DIAGNOSIS — R569 Unspecified convulsions: Secondary | ICD-10-CM

## 2021-10-20 ENCOUNTER — Other Ambulatory Visit: Payer: Self-pay | Admitting: Family Medicine

## 2021-10-20 DIAGNOSIS — I10 Essential (primary) hypertension: Secondary | ICD-10-CM

## 2021-10-21 NOTE — Telephone Encounter (Signed)
Requested Prescriptions  Pending Prescriptions Disp Refills   lisinopril-hydrochlorothiazide (ZESTORETIC) 20-12.5 MG tablet [Pharmacy Med Name: Lisinopril-hydroCHLOROthiazide 20-12.5 MG Oral Tablet] 90 tablet 0    Sig: Take 1 tablet by mouth once daily     Cardiovascular:  ACEI + Diuretic Combos Failed - 10/20/2021  8:07 AM      Failed - Na in normal range and within 180 days    Sodium  Date Value Ref Range Status  09/02/2021 133 (L) 135 - 145 mmol/L Final  10/03/2019 140 134 - 144 mmol/L Final  10/28/2012 138 136 - 145 mmol/L Final         Passed - K in normal range and within 180 days    Potassium  Date Value Ref Range Status  09/02/2021 4.4 3.5 - 5.1 mmol/L Final  10/28/2012 4.0 3.5 - 5.1 mmol/L Final         Passed - Cr in normal range and within 180 days    Creat  Date Value Ref Range Status  01/09/2020 0.96 0.60 - 1.35 mg/dL Final   Creatinine, Ser  Date Value Ref Range Status  09/02/2021 0.97 0.61 - 1.24 mg/dL Final         Passed - eGFR is 30 or above and within 180 days    EGFR (African American)  Date Value Ref Range Status  10/28/2012 >60  Final   GFR calc Af Amer  Date Value Ref Range Status  10/03/2019 115 >59 mL/min/1.73 Final   EGFR (Non-African Amer.)  Date Value Ref Range Status  10/28/2012 >60  Final    Comment:    eGFR values <25m/min/1.73 m2 may be an indication of chronic kidney disease (CKD). Calculated eGFR is useful in patients with stable renal function. The eGFR calculation will not be reliable in acutely ill patients when serum creatinine is changing rapidly. It is not useful in  patients on dialysis. The eGFR calculation may not be applicable to patients at the low and high extremes of body sizes, pregnant women, and vegetarians.    GFR, Estimated  Date Value Ref Range Status  09/02/2021 >60 >60 mL/min Final    Comment:    (NOTE) Calculated using the CKD-EPI Creatinine Equation (2021)          Passed - Patient is not  pregnant      Passed - Last BP in normal range    BP Readings from Last 1 Encounters:  09/05/21 120/70         Passed - Valid encounter within last 6 months    Recent Outpatient Visits          1 month ago [facility] onset seizure [facility]   [facility]   5 months ago Essential hypertension   [place]   9 months ago Nonallopathic lesion of costochondral region   [place]   12 months ago Essential hypertension   [facility]   1 year ago Encounter for examination required by Department of Transportation (DOT)   [facility]      Future Appointments            [facility] 1 [facility]

## 2021-10-28 ENCOUNTER — Ambulatory Visit: Payer: Self-pay | Admitting: Family Medicine

## 2021-12-03 ENCOUNTER — Telehealth: Payer: Self-pay

## 2021-12-03 NOTE — Telephone Encounter (Signed)
Copied from CRM [id number]. Topic: General - Call Back - No Documentation ?>> Dec 03, 2021 10:56 AM Marylen Ponto wrote: ?Reason for CRM: Pt stated he had a missed call from the office. Pt requests call back ?

## 2022-01-22 ENCOUNTER — Other Ambulatory Visit: Payer: Self-pay | Admitting: Family Medicine

## 2022-01-22 DIAGNOSIS — I10 Essential (primary) hypertension: Secondary | ICD-10-CM

## 2022-02-20 ENCOUNTER — Other Ambulatory Visit: Payer: Self-pay | Admitting: Family Medicine

## 2022-02-20 DIAGNOSIS — I10 Essential (primary) hypertension: Secondary | ICD-10-CM

## 2022-02-20 NOTE — Telephone Encounter (Signed)
Patient will need an office visit for further refills. Requested Prescriptions  Pending Prescriptions Disp Refills  . lisinopril-hydrochlorothiazide (ZESTORETIC) 20-12.5 MG tablet [Pharmacy Med Name: Lisinopril-hydroCHLOROthiazide 20-12.5 MG Oral Tablet] 30 tablet 0    Sig: Take 1 tablet by mouth once daily     Cardiovascular:  ACEI + Diuretic Combos Failed - 02/20/2022 12:42 PM      Failed - Na in normal range and within 180 days    Sodium  Date Value Ref Range Status  09/02/2021 133 (L) 135 - 145 mmol/L Final  10/03/2019 140 134 - 144 mmol/L Final  10/28/2012 138 136 - 145 mmol/L Final         Passed - K in normal range and within 180 days    Potassium  Date Value Ref Range Status  09/02/2021 4.4 3.5 - 5.1 mmol/L Final  10/28/2012 4.0 3.5 - 5.1 mmol/L Final         Passed - Cr in normal range and within 180 days    Creat  Date Value Ref Range Status  01/09/2020 0.96 0.60 - 1.35 mg/dL Final   Creatinine, Ser  Date Value Ref Range Status  09/02/2021 0.97 0.61 - 1.24 mg/dL Final         Passed - eGFR is 30 or above and within 180 days    EGFR (African American)  Date Value Ref Range Status  10/28/2012 >60  Final   GFR calc Af Amer  Date Value Ref Range Status  10/03/2019 115 >59 mL/min/1.73 Final   EGFR (Non-African Amer.)  Date Value Ref Range Status  10/28/2012 >60  Final    Comment:    eGFR values <57m/min/1.73 m2 may be an indication of chronic kidney disease (CKD). Calculated eGFR is useful in patients with stable renal function. The eGFR calculation will not be reliable in acutely ill patients when serum creatinine is changing rapidly. It is not useful in  patients on dialysis. The eGFR calculation may not be applicable to patients at the low and high extremes of body sizes, pregnant women, and vegetarians.    GFR, Estimated  Date Value Ref Range Status  09/02/2021 >60 >60 mL/min Final    Comment:    (NOTE) Calculated using the CKD-EPI Creatinine  Equation (2021)          Passed - Patient is not pregnant      Passed - Last BP in normal range    BP Readings from Last 1 Encounters:  09/05/21 120/70         Passed - Valid encounter within last 6 months    Recent Outpatient Visits          5 months ago New onset seizure ([facility]   [facility]   10 months ago Essential hypertension   [facility]   1 year ago [facility]allopathic lesion of costochondral region   [facility]   1 year ago Essential hypertension   [facility]   1 year ago Encounter for examination required by Department of Transportation (DOT)   [facility]

## 2022-04-03 ENCOUNTER — Ambulatory Visit: Payer: Self-pay

## 2022-04-03 NOTE — Telephone Encounter (Signed)
   Chief Complaint: Pt. Having "OCD"issues. Repative motions. Movements. Symptoms: Above Frequency: 6 months ago Pertinent Negatives: Patient denies  Disposition: [] ED /[] Urgent Care (no appt availability in office) / [x] Appointment(In office/virtual)/ []  [facility]/ [] Home Care/ [] Refused Recommended Disposition /[] [facility]/ []  Follow-up with PCP Additional Notes:   Reason for Disposition  Nursing judgment or information in reference  Answer Assessment - Initial Assessment Questions 1. REASON FOR CALL: "What is your main concern right now?"     Repetitive motions 2. ONSET: "When did the  start?"     6 months ago 3. SEVERITY: "How bad is the ?"     Severe 4. FEVER: "Do you have a fever?"     No 5. OTHER SYMPTOMS: "Do you have any other new symptoms?"     Yes 6. TREATMENTS AND RESPONSE: "What have you done so far to try to make this better? What medicines have you used?"     None 7. PREGNANCY: "Is there any chance you are pregnant?" "When was your last menstrual period?"     N/a  Protocols used: No Guideline Available-A-AH

## 2022-04-07 ENCOUNTER — Ambulatory Visit: Payer: Medicaid Other | Admitting: Family Medicine

## 2022-04-07 ENCOUNTER — Telehealth: Payer: Self-pay | Admitting: Family Medicine

## 2022-04-07 NOTE — Telephone Encounter (Signed)
Copied from CRM [id number]. Topic: General - Other >> Apr 07, 2022  8:21 AM Lyman Speller wrote: Reason for CRM: Pts insurance ending on Aug 1st and the pts parents wanted to let Delice Bison know / please advise

## 2022-04-09 IMAGING — CT CT HEAD W/O CM
4 series · 17 of 47 positions shown, 19 images · non-contrast
Comparison: 05/06/2007

CLINICAL DATA: Seizure, fell

EXAM:
CT HEAD WITHOUT CONTRAST
TECHNIQUE: Contiguous axial images were obtained from the base of the skull
through the vertex without intravenous contrast.

[Series 2: head bone · axial · 0.44mm/px · z∈[-156,-98]mm · 4 of 84 slices shown]
[im 9/84  bone]
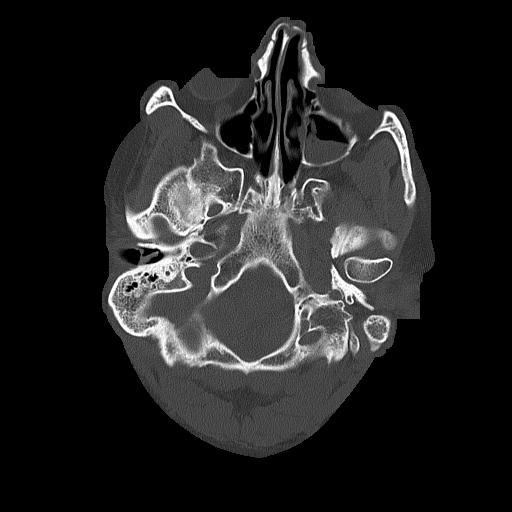
[im 17/84  bone]
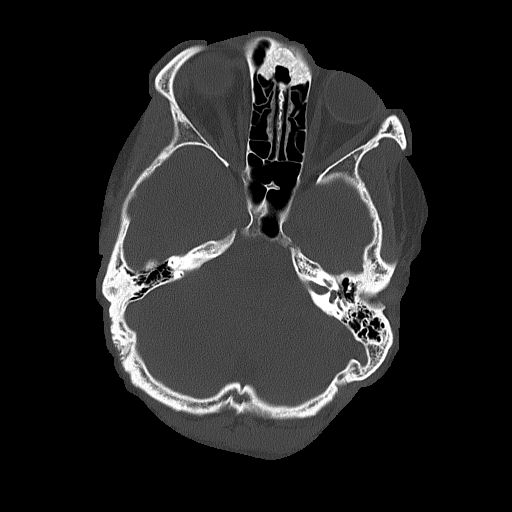
[im 25/84  bone]
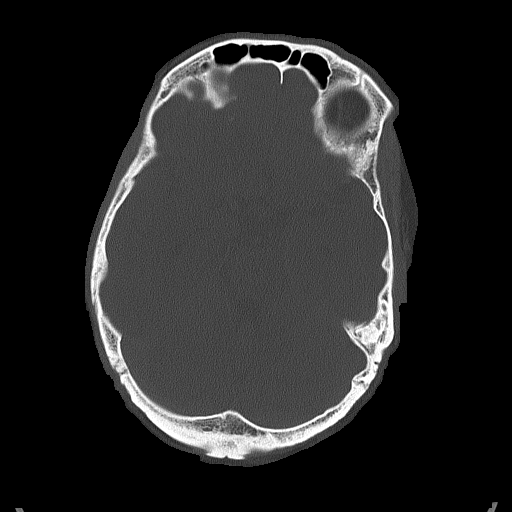
[im 38/84  bone]
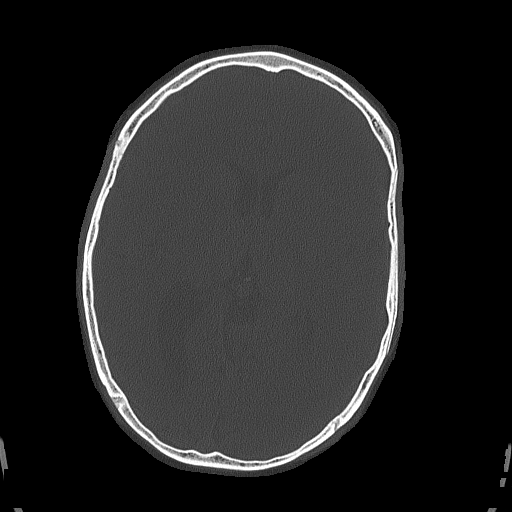

[Series 3: head wo · axial · 0.44mm/px · z∈[-152,-32]mm · 7 of 34 slices shown, 9 images]
[im 5/34  brain]
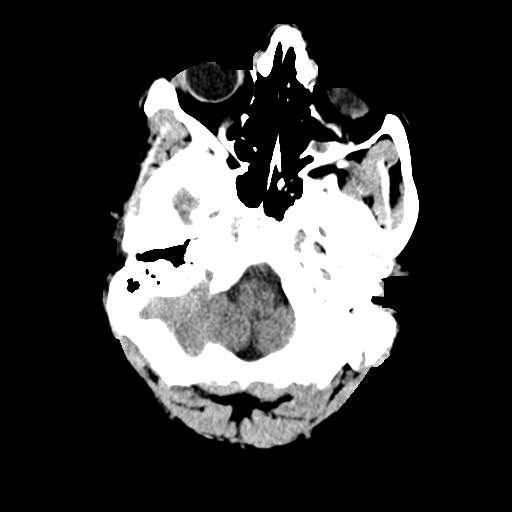
[im 5/34  bone]
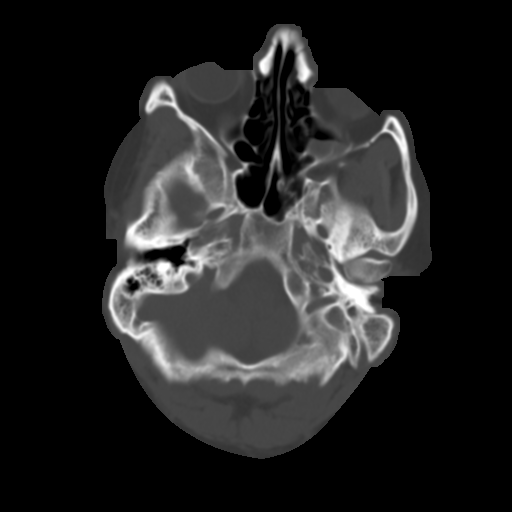
[im 9/34  brain]
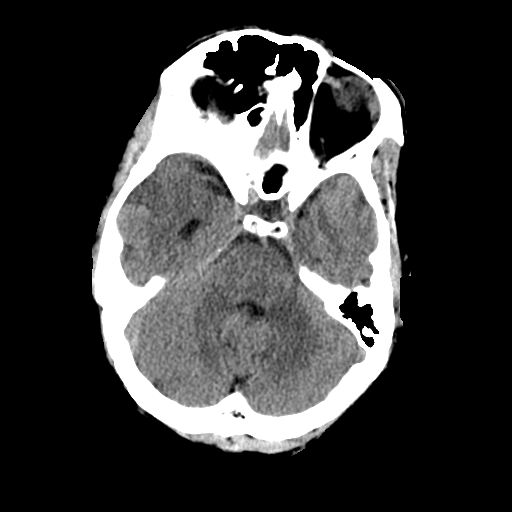
[im 13/34  brain]
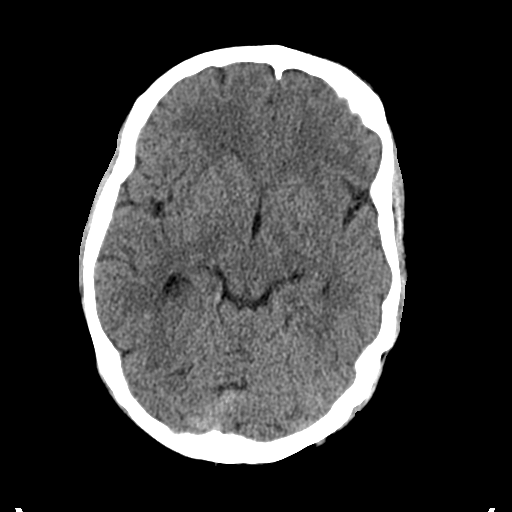
[im 17/34  brain]
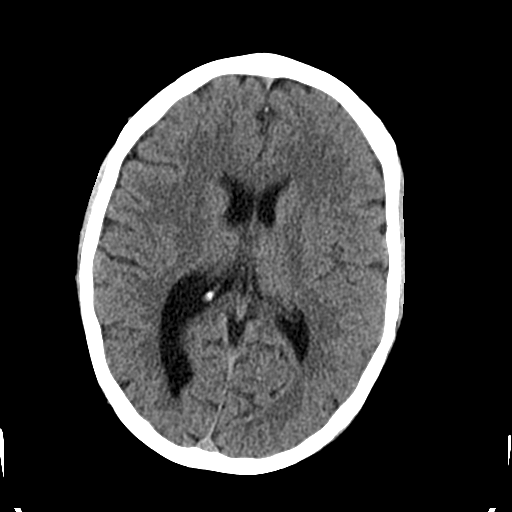
[im 21/34  brain]
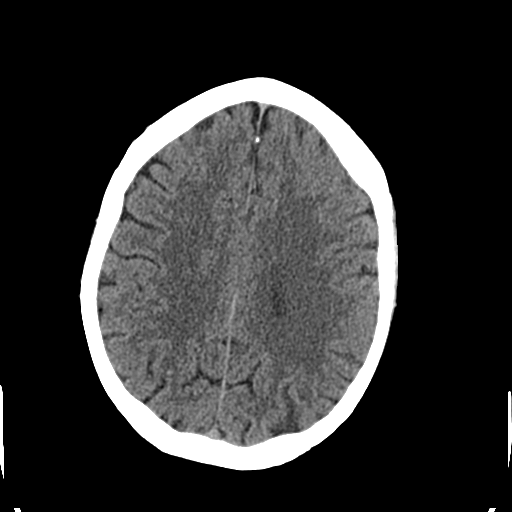
[im 21/34  bone]
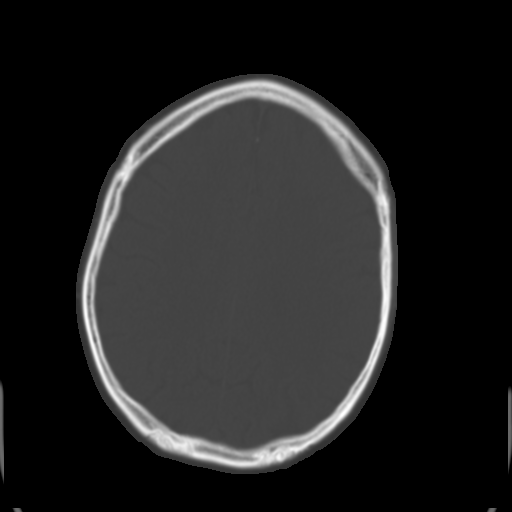
[im 25/34  brain]
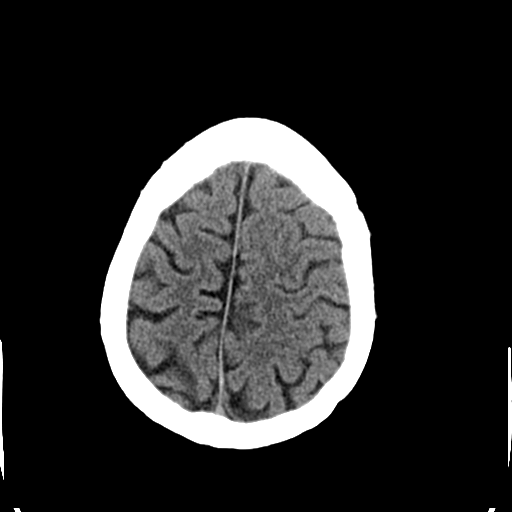
[im 29/34  brain]
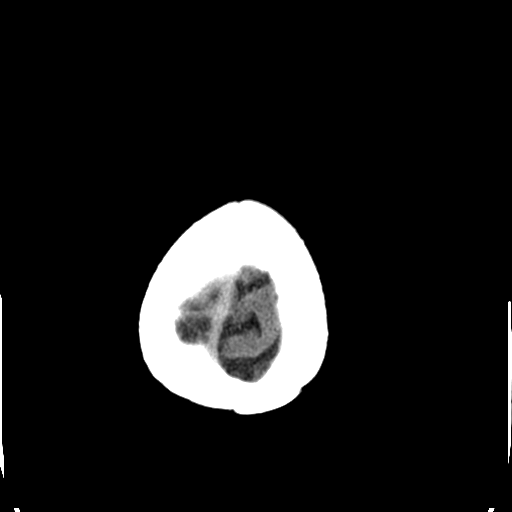

[Series 4: coronal soft tissue · coronal · 0.33mm/px · 3 of 71 slices shown]
[im 26/71  brain]
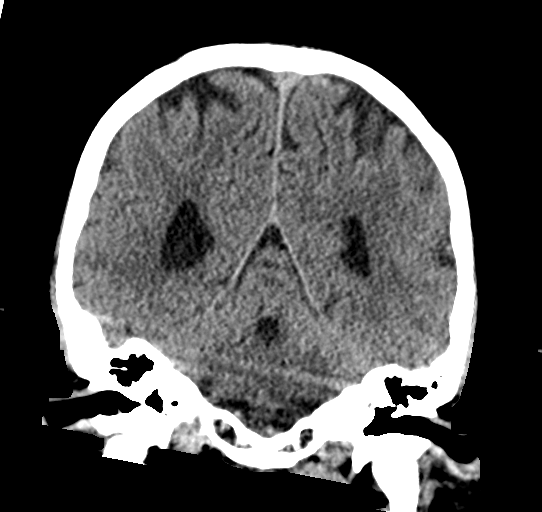
[im 32/71  brain]
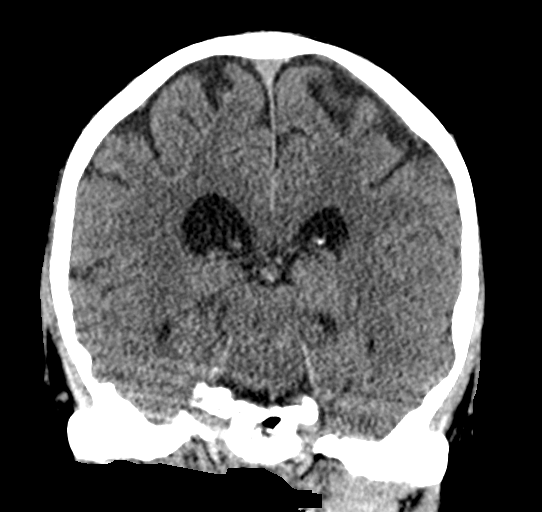
[im 39/71  brain]
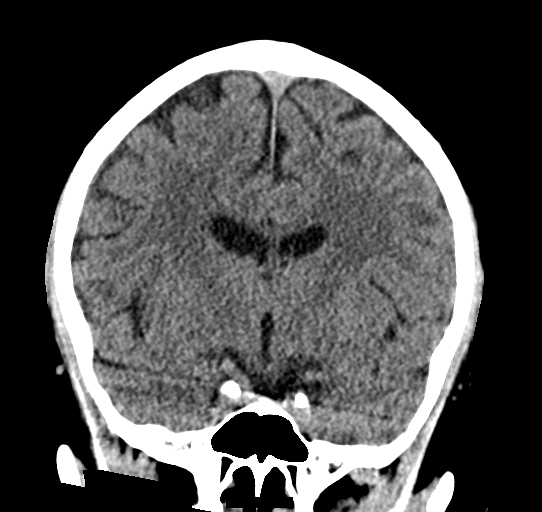

[Series 5: sagittal soft tissue · sagittal · 0.33mm/px · 3 of 61 slices shown]
[im 23/61  brain]
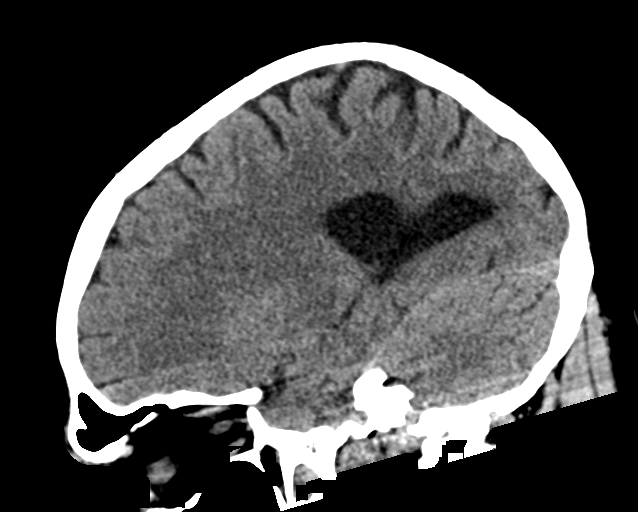
[im 31/61  brain]
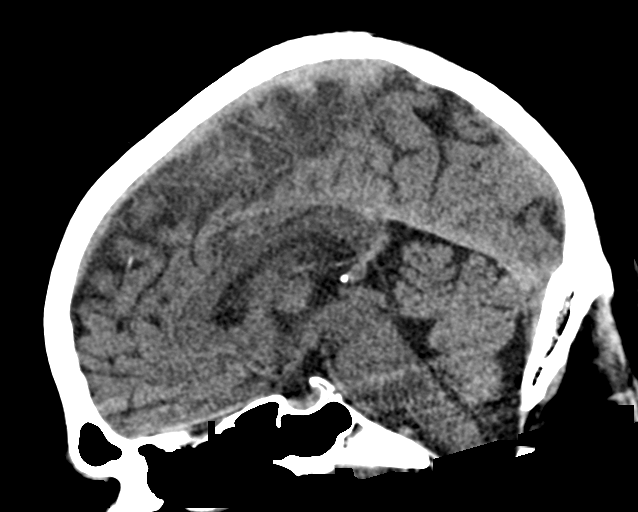
[im 38/61  brain]
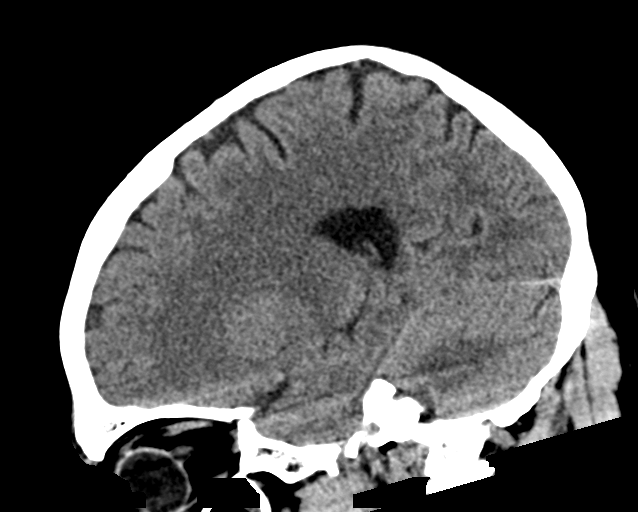

[17 of 47 positions shown; findings below may reference images not displayed]

FINDINGS: Brain: No acute infarct or hemorrhage. Lateral ventricles and
midline structures are stable. No acute extra-axial fluid
collections. No mass effect.

Vascular: No hyperdense vessel or unexpected calcification.

Skull: Normal. Negative for fracture or focal lesion.

Sinuses/Orbits: Mild mucosal thickening left maxillary sinus.
Remaining paranasal sinuses are clear.

Other: None.
IMPRESSION: 1. Stable head CT, no acute process.

## 2022-04-09 IMAGING — CT CT CERVICAL SPINE W/O CM
3 of 4 series · 11 of 33 positions shown, 13 images · non-contrast
Comparison: None.

CLINICAL DATA: Seizure, fell

EXAM:
CT CERVICAL SPINE WITHOUT CONTRAST
TECHNIQUE: Multidetector CT imaging of the cervical spine was performed without
intravenous contrast. Multiplanar CT image reconstructions were also
generated.

[Series 4: sagittal bone · sagittal · 0.27mm/px · 5 of 85 slices shown, 6 images]
[im 29/85  bone]
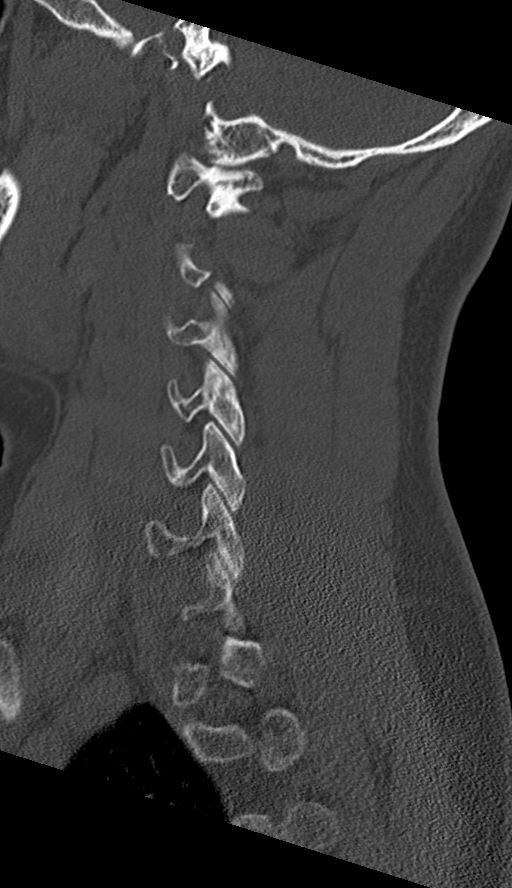
[im 36/85  bone]
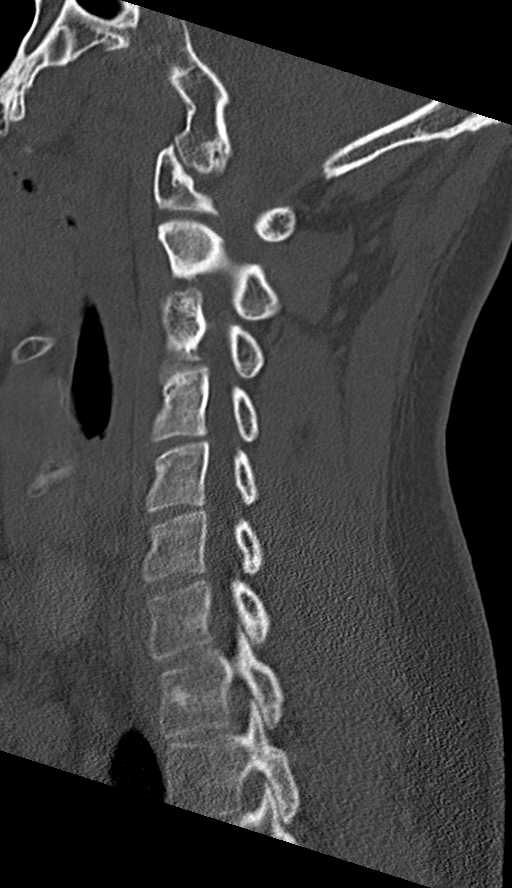
[im 43/85  soft-tissue]
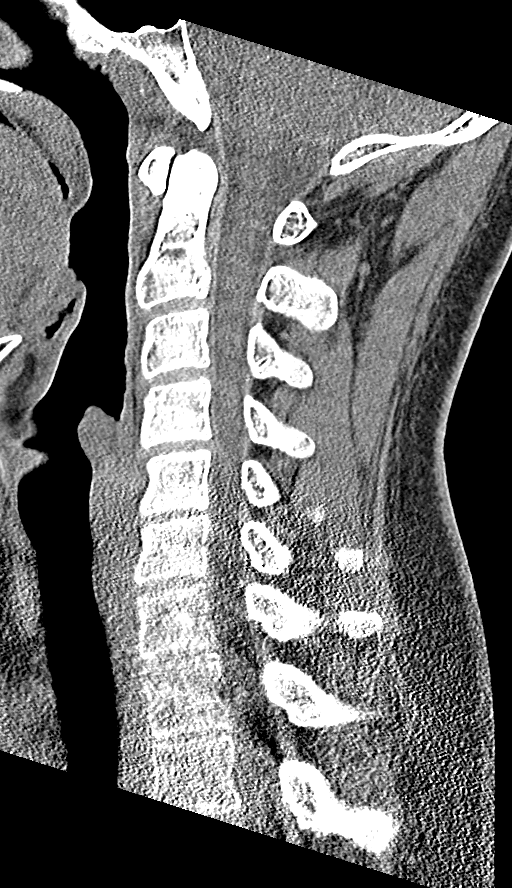
[im 43/85  bone]
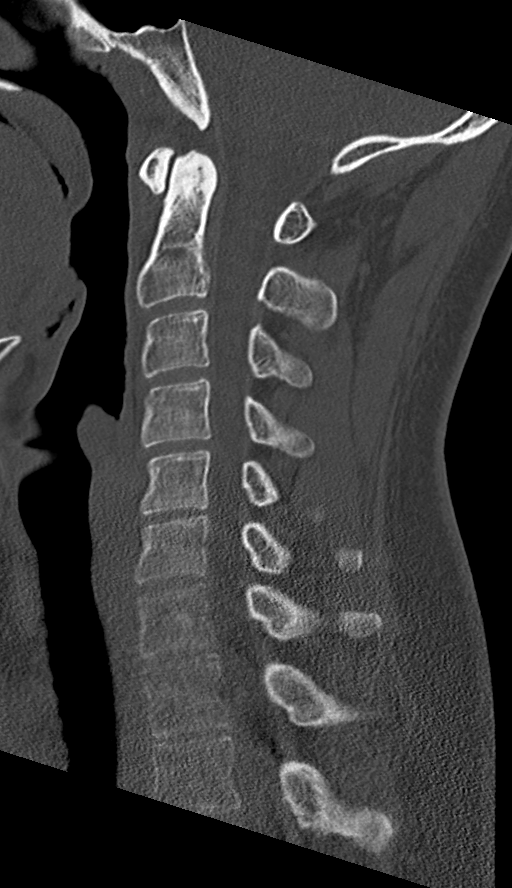
[im 50/85  bone]
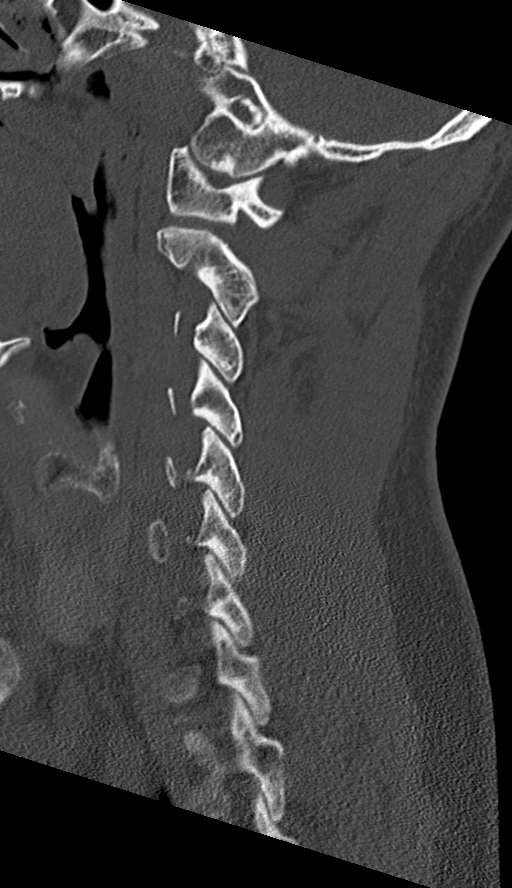
[im 57/85  bone]
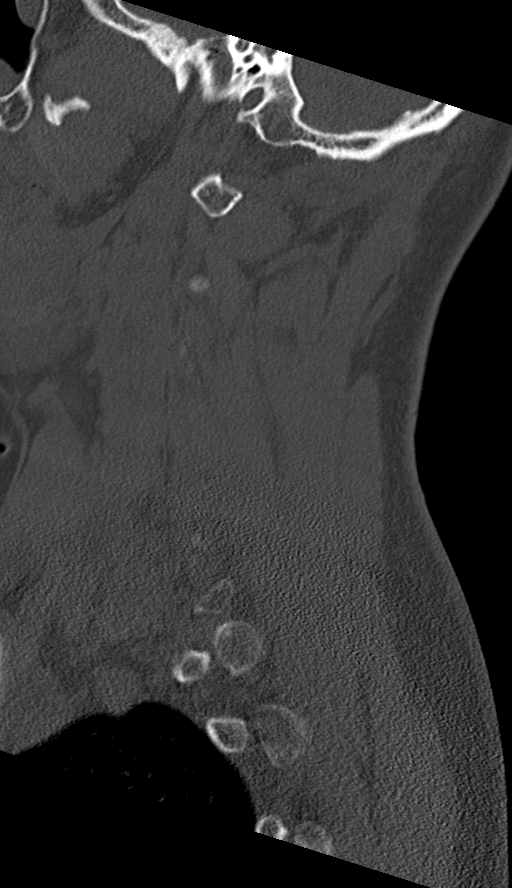

[Series 5: coronal bone · coronal · 0.33mm/px · 3 of 69 slices shown]
[im 14/69  bone]
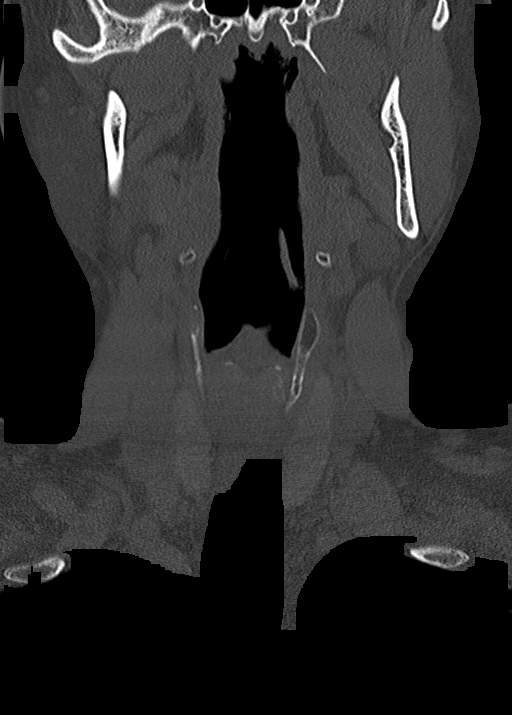
[im 28/69  bone]
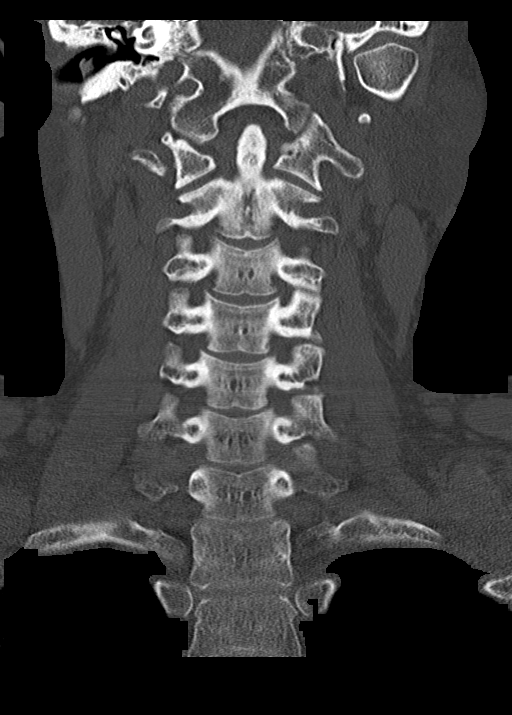
[im 41/69  bone]
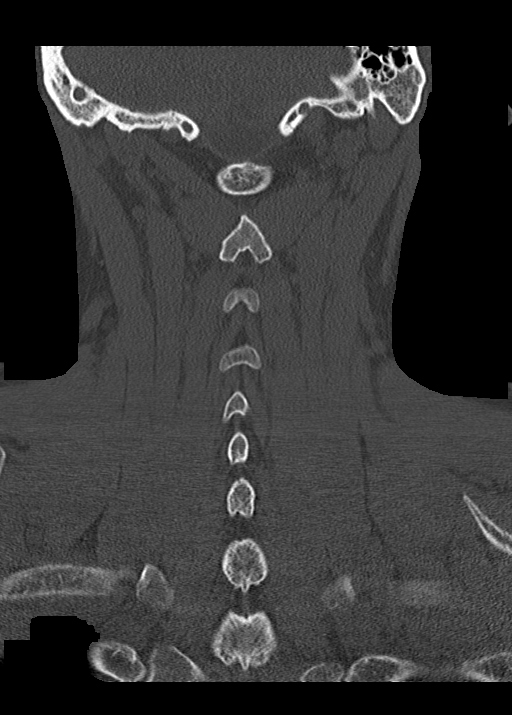

[Series 6: orthogonal bone · axial · 0.27mm/px · z∈[-335,-206]mm · 3 of 119 slices shown, 4 images]
[im 34/119  soft-tissue]
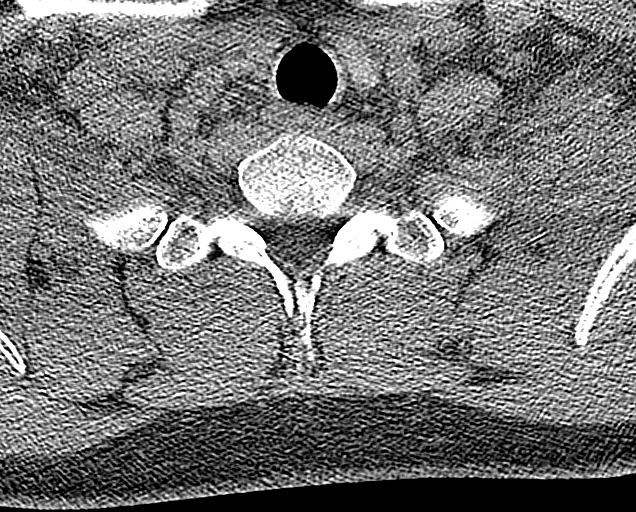
[im 34/119  bone]
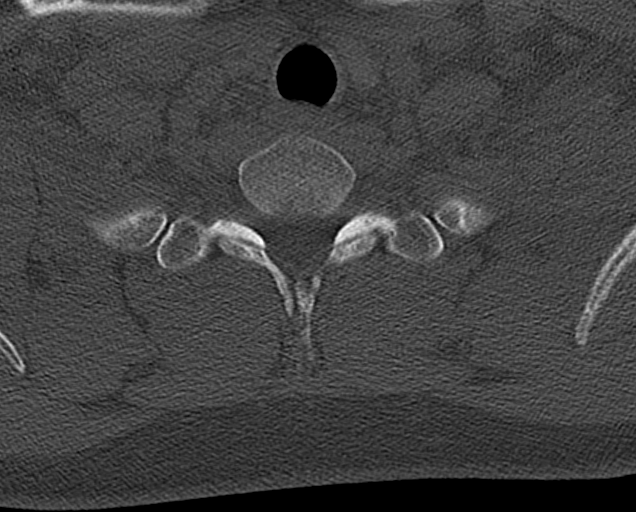
[im 68/119  bone]
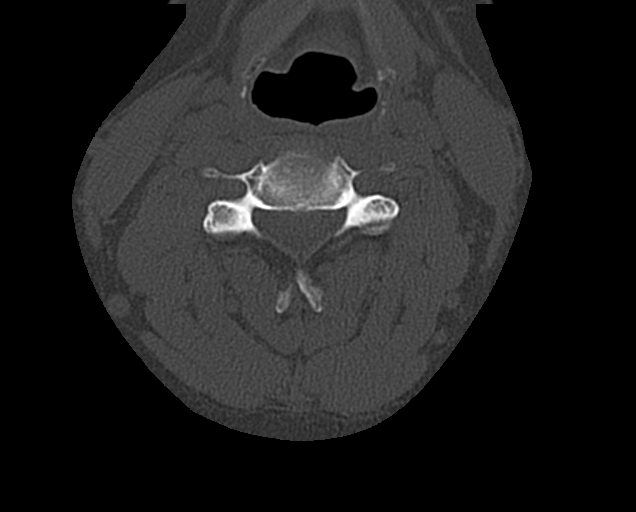
[im 102/119  bone]
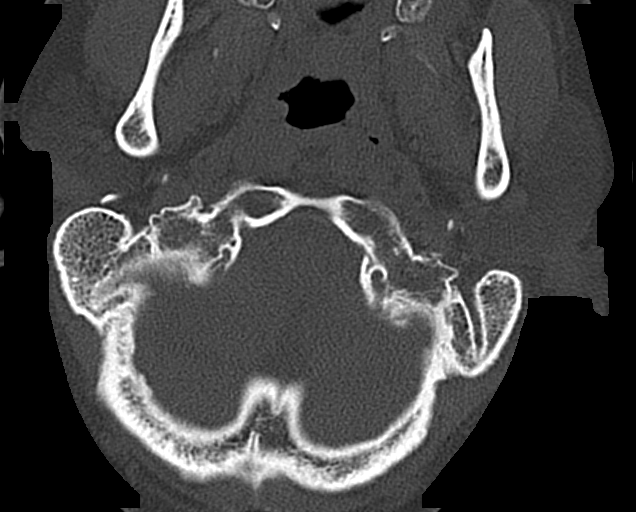

[11 of 33 positions shown; findings below may reference images not displayed]

FINDINGS: Alignment: Alignment is anatomic.

Skull base and vertebrae: No acute fracture. No primary bone lesion
or focal pathologic process.

Soft tissues and spinal canal: No prevertebral fluid or swelling. No
visible canal hematoma.

Disc levels:  No significant spondylosis or facet hypertrophy.

Upper chest: Airway is patent.  Lung apices are clear.

Other: Reconstructed images demonstrate no additional findings.
IMPRESSION: 1. No acute cervical spine fracture.

## 2022-05-30 ENCOUNTER — Other Ambulatory Visit: Payer: Self-pay | Admitting: Family Medicine

## 2022-05-30 DIAGNOSIS — I10 Essential (primary) hypertension: Secondary | ICD-10-CM

## 2022-06-15 ENCOUNTER — Other Ambulatory Visit: Payer: Self-pay | Admitting: Family Medicine

## 2022-06-15 DIAGNOSIS — I10 Essential (primary) hypertension: Secondary | ICD-10-CM

## 2022-06-18 ENCOUNTER — Other Ambulatory Visit
Admission: RE | Admit: 2022-06-18 | Discharge: 2022-06-18 | Disposition: A | Discharge status: Home / Self Care | Payer: Self-pay | Attending: Family Medicine | Admitting: Family Medicine

## 2022-06-18 ENCOUNTER — Encounter: Payer: Self-pay | Admitting: Family Medicine

## 2022-06-18 ENCOUNTER — Ambulatory Visit (INDEPENDENT_AMBULATORY_CARE_PROVIDER_SITE_OTHER): Payer: Self-pay | Admitting: Family Medicine

## 2022-06-18 VITALS — BP 126/70 | HR 64 | Ht 75.0 in | Wt 225.0 lb

## 2022-06-18 DIAGNOSIS — E663 Overweight: Secondary | ICD-10-CM

## 2022-06-18 DIAGNOSIS — E785 Hyperlipidemia, unspecified: Secondary | ICD-10-CM | POA: Insufficient documentation

## 2022-06-18 DIAGNOSIS — I10 Essential (primary) hypertension: Secondary | ICD-10-CM | POA: Insufficient documentation

## 2022-06-18 LAB — LIPID PANEL
Cholesterol: 183 mg/dL (ref 0–200)
HDL: 52 mg/dL (ref >40)
LDL Cholesterol: 125 mg/dL — ABNORMAL HIGH (ref 0–99)
Total CHOL/HDL Ratio: 3.5 RATIO
Triglycerides: 30 mg/dL (ref <150)
VLDL: 6 mg/dL (ref 0–40)

## 2022-06-18 LAB — RENAL FUNCTION PANEL
Albumin: 4.4 g/dL (ref 3.5–5.0)
Anion gap: 4 — ABNORMAL LOW (ref 5–15)
BUN: 16 mg/dL (ref 6–20)
CO2: 27 mmol/L (ref 22–32)
Calcium: 8.6 mg/dL — ABNORMAL LOW (ref 8.9–10.3)
Chloride: 99 mmol/L (ref 98–111)
Creatinine, Ser: 0.9 mg/dL (ref 0.61–1.24)
GFR, Estimated: >60 mL/min (ref >60)
Glucose, Bld: 102 mg/dL — ABNORMAL HIGH (ref 70–99)
Phosphorus: 3.3 mg/dL (ref 2.5–4.6)
Potassium: 3.9 mmol/L (ref 3.5–5.1)
Sodium: 130 mmol/L — ABNORMAL LOW (ref 135–145)

## 2022-06-18 MED ORDER — LISINOPRIL-HYDROCHLOROTHIAZIDE 20-12.5 MG PO TABS: 1.0000 | ORAL_TABLET | Freq: Every day | ORAL | 1 refills | Status: DC

## 2022-06-18 NOTE — Progress Notes (Signed)
Date:  06/18/2022   Name:  Christian Doyle   DOB:  March 28, 1991   MRN:  [id number]   Chief Complaint: Hypertension  Hypertension This is a chronic problem. The current episode started more than 1 year ago. The problem has been gradually improving since onset. The problem is controlled. Pertinent negatives include no chest pain, headaches, neck pain, orthopnea, palpitations, PND or shortness of breath. There are no associated agents to hypertension. There are no known risk factors for coronary artery disease. Past treatments include ACE inhibitors and diuretics. The current treatment provides moderate improvement. There are no compliance problems.     Lab Results  Component Value Date   NA 133 (L) 09/02/2021   K 4.4 09/02/2021   CO2 25 09/02/2021   GLUCOSE 120 (H) 09/02/2021   BUN 18 09/02/2021   CREATININE 0.97 09/02/2021   CALCIUM 9.8 09/02/2021   GFRNONAA >60 09/02/2021   Lab Results  Component Value Date   CHOL 175 10/03/2019   HDL 63 10/03/2019   LDLCALC 99 10/03/2019   TRIG 71 10/03/2019   Lab Results  Component Value Date   TSH 1.08 10/28/2012   No results found for: "HGBA1C" Lab Results  Component Value Date   WBC 10.4 09/02/2021   HGB 13.5 09/02/2021   HCT 39.8 09/02/2021   MCV 91.1 09/02/2021   PLT 185 09/02/2021   Lab Results  Component Value Date   ALT 41 09/02/2021   AST 39 09/02/2021   GGT 137 (H) 11/08/2019   ALKPHOS 58 09/02/2021   BILITOT 0.5 09/02/2021   No results found for: "25OHVITD2", "25OHVITD3", "VD25OH"   Review of Systems  Constitutional:  Negative for chills and fever.  HENT:  Negative for drooling, ear discharge, ear pain and sore throat.   Respiratory:  Negative for cough, shortness of breath and wheezing.   Cardiovascular:  Negative for chest pain, palpitations, orthopnea, leg swelling and PND.  Gastrointestinal:  Negative for abdominal pain, blood in stool, constipation, diarrhea and nausea.  Endocrine: Negative for  polydipsia.  Genitourinary:  Negative for dysuria, frequency, hematuria and urgency.  Musculoskeletal:  Negative for back pain, myalgias and neck pain.  Skin:  Negative for rash.  Allergic/Immunologic: Negative for environmental allergies.  Neurological:  Negative for dizziness and headaches.  Hematological:  Does not bruise/bleed easily.  Psychiatric/Behavioral:  Negative for suicidal ideas. The patient is not nervous/anxious.     Patient Active Problem List   Diagnosis Date Noted   Essential hypertension 10/03/2019   Chronic pain syndrome 06/19/2017   Opioid dependence in remission ([facility]) 06/19/2017    No Known Allergies  Past Surgical History:  Procedure Laterality Date   TONSILLECTOMY      Social History   Tobacco Use   Smoking status: Every Day    Packs/day: 0.50    Years: 12.00    Total pack years: 6.00    Types: Cigarettes   Smokeless tobacco: Former  Scientific laboratory technician Use: Never used  Substance Use Topics   Alcohol use: Never   Drug use: Yes    Types: Marijuana, Heroin, Oxycodone    Comment: prescribed methadone     Medication list has been reviewed and updated.  Current Meds  Medication Sig   lisinopril-hydrochlorothiazide (ZESTORETIC) 20-12.5 MG tablet Take 1 tablet by mouth once daily   methadone (DOLOPHINE) 1 mg/ml oral solution Take 120 mg/kg by mouth daily.       06/18/2022    8:46 AM 09/05/2021  11:05 AM 04/25/2021    9:18 AM 01/04/2021    8:11 AM  GAD 7 : Generalized Anxiety Score  Nervous, Anxious, on Edge 0 1 1 2   Control/stop worrying 0 0 1 1  Worry too much - different things 0 1 0 1  Trouble relaxing 0 1 0 0  Restless 0 0 1 0  Easily annoyed or irritable 0 0 1 0  Afraid - awful might happen 0 0 0 0  Total GAD 7 Score 0 3 4 4   Anxiety Difficulty Not difficult at all Not difficult at all Somewhat difficult Not difficult at all       06/18/2022    8:46 AM 09/05/2021   11:02 AM 04/25/2021    9:07 AM  Depression screen PHQ 2/9   Decreased Interest 0 3 0  Down, Depressed, Hopeless 0 2 1  PHQ - 2 Score 0 5 1  Altered sleeping 0 1 0  Tired, decreased energy 0 1 0  Change in appetite 0 1 0  Feeling bad or failure about yourself  0 3 0  Trouble concentrating 0 0 0  Moving slowly or fidgety/restless 0 1 0  Suicidal thoughts 0 0 0  PHQ-9 Score 0 12 1  Difficult doing work/chores Not difficult at all Somewhat difficult Somewhat difficult    BP Readings from Last 3 Encounters:  06/18/22 126/70  09/05/21 120/70  09/02/21 (!) 135/59    Physical Exam Vitals and nursing note reviewed.  HENT:     Head: Normocephalic.     Right Ear: Tympanic membrane and external ear normal.     Left Ear: Tympanic membrane and external ear normal.     Nose: Nose normal.  Eyes:     General: No scleral icterus.       Right eye: No discharge.        Left eye: No discharge.     Conjunctiva/sclera: Conjunctivae normal.     Pupils: Pupils are equal, round, and reactive to light.  Neck:     Thyroid: No thyromegaly.     Vascular: No JVD.     Trachea: No tracheal deviation.  Cardiovascular:     Rate and Rhythm: Normal rate and regular rhythm.     Heart sounds: Normal heart sounds. No murmur heard.    No friction rub. No gallop.  Pulmonary:     Effort: No respiratory distress.     Breath sounds: Normal breath sounds. No wheezing, rhonchi or rales.  Abdominal:     General: Bowel sounds are normal.     Palpations: Abdomen is soft. There is no mass.     Tenderness: There is no abdominal tenderness. There is no guarding or rebound.  Musculoskeletal:        General: No tenderness. Normal range of motion.     Cervical back: Normal range of motion and neck supple.  Lymphadenopathy:     Cervical: No cervical adenopathy.  Skin:    General: Skin is warm.     Findings: No rash.  Neurological:     Mental Status: He is alert.     Wt Readings from Last 3 Encounters:  06/18/22 225 lb (102.1 kg)  09/05/21 217 lb (98.4 kg)  04/25/21  203 lb (92.1 kg)    BP 126/70   Pulse 64   Ht 6\' 3"  (1.905 m)   Wt 225 lb (102.1 kg)   BMI 28.12 kg/m   Assessment and Plan: 1. Essential hypertension Chronic.  Controlled.  Stable.  Blood pressure today is 126/70.  Asymptomatic.  Tolerating medication well.  Patient will continue lisinopril hydrochlorothiazide 20-12.5 mg once a day.  We will check renal function panel for GFR and electrolytes.  We will recheck patient in 6 months. - lisinopril-hydrochlorothiazide (ZESTORETIC) 20-12.5 MG tablet; Take 1 tablet by mouth daily.  Dispense: 90 tablet; Refill: 1 - Renal Function Panel  2. Overweight (BMI 25.0-29.9) Mildly overweight patient has been reemphasized with DASH diet.Health risks of being over weight were discussed and patient was counseled on weight loss options and exercise.  We will check lipid panel and that its been over 2 years since last exam. - Lipid Panel With LDL/HDL Ratio     Otilio Miu, MD

## 2022-06-18 NOTE — Patient Instructions (Signed)

## 2022-08-26 ENCOUNTER — Encounter: Payer: Self-pay | Admitting: Family Medicine

## 2022-08-26 ENCOUNTER — Ambulatory Visit: Payer: Medicaid Other | Admitting: Family Medicine

## 2022-08-26 VITALS — BP 100/60 | HR 60 | Ht 75.0 in | Wt 227.0 lb

## 2022-08-26 DIAGNOSIS — M7989 Other specified soft tissue disorders: Secondary | ICD-10-CM | POA: Diagnosis not present

## 2022-08-26 NOTE — Progress Notes (Signed)
Date:  08/26/2022   Name:  Christian Doyle   DOB:  02-12-1991   MRN:  [id number]   Chief Complaint: Leg Swelling ([place]- the past 2 weeks it is noticeable. Sleeps in a chair and "stands a lot". R) leg is worse than left)  Leg Pain  The incident occurred more than 1 week ago. Incident location: slipped coming out of shower. The injury mechanism was a fall. The pain is present in the right leg and right foot. The quality of the pain is described as aching. The pain is at a severity of 4/10. The pain has been Fluctuating since onset. Pertinent negatives include no inability to bear weight, loss of sensation, muscle weakness, numbness or tingling. The symptoms are aggravated by movement and palpation. He has tried elevation (last night) for the symptoms. The treatment provided moderate relief.    Lab Results  Component Value Date   NA 130 (L) 06/18/2022   K 3.9 06/18/2022   CO2 27 06/18/2022   GLUCOSE 102 (H) 06/18/2022   BUN 16 06/18/2022   CREATININE 0.90 06/18/2022   CALCIUM 8.6 (L) 06/18/2022   GFRNONAA >60 06/18/2022   Lab Results  Component Value Date   CHOL 183 06/18/2022   HDL 52 06/18/2022   LDLCALC 125 (H) 06/18/2022   TRIG 30 06/18/2022   CHOLHDL 3.5 06/18/2022   Lab Results  Component Value Date   TSH 1.08 10/28/2012   No results found for: "HGBA1C" Lab Results  Component Value Date   WBC 10.4 09/02/2021   HGB 13.5 09/02/2021   HCT 39.8 09/02/2021   MCV 91.1 09/02/2021   PLT 185 09/02/2021   Lab Results  Component Value Date   ALT 41 09/02/2021   AST 39 09/02/2021   GGT 137 (H) 11/08/2019   ALKPHOS 58 09/02/2021   BILITOT 0.5 09/02/2021   No results found for: "25OHVITD2", "25OHVITD3", "VD25OH"   Review of Systems  Constitutional:  Negative for chills, diaphoresis and fever.  Respiratory:  Negative for cough, choking, chest tightness, shortness of breath, wheezing and stridor.   Cardiovascular:  Positive  for chest pain. Negative for palpitations and leg swelling.  Gastrointestinal:  Negative for abdominal distention, abdominal pain, anal bleeding and blood in stool.  Neurological:  Negative for tingling and numbness.    Patient Active Problem List   Diagnosis Date Noted   Essential hypertension 10/03/2019   Chronic pain syndrome 06/19/2017   Opioid dependence in remission ([facility]) 06/19/2017    No Known Allergies  Past Surgical History:  Procedure Laterality Date   TONSILLECTOMY      Social History   Tobacco Use   Smoking status: Every Day    Packs/day: 0.50    Years: 12.00    Total pack years: 6.00    Types: Cigarettes   Smokeless tobacco: Former  Building services engineer Use: Never used  Substance Use Topics   Alcohol use: Never   Drug use: Yes    Types: Marijuana, Heroin, Oxycodone    Comment: prescribed methadone     Medication list has been reviewed and updated.  Current Meds  Medication Sig   lisinopril-hydrochlorothiazide (ZESTORETIC) 20-12.5 MG tablet Take 1 tablet by mouth daily.   methadone (DOLOPHINE) 1 mg/ml oral solution Take 120 mg/kg by mouth daily.       08/26/2022    2:29 PM 06/18/2022    8:46 AM 09/05/2021   11:05 AM 04/25/2021  9:18 AM  GAD 7 : Generalized Anxiety Score  Nervous, Anxious, on Edge 0 0 1 1  Control/stop worrying 0 0 0 1  Worry too much - different things 0 0 1 0  Trouble relaxing 0 0 1 0  Restless 0 0 0 1  Easily annoyed or irritable 0 0 0 1  Afraid - awful might happen 0 0 0 0  Total GAD 7 Score 0 0 3 4  Anxiety Difficulty Not difficult at all Not difficult at all Not difficult at all Somewhat difficult       08/26/2022    2:29 PM 06/18/2022    8:46 AM 09/05/2021   11:02 AM  Depression screen PHQ 2/9  Decreased Interest 0 0 3  Down, Depressed, Hopeless 0 0 2  PHQ - 2 Score 0 0 5  Altered sleeping 0 0 1  Tired, decreased energy 0 0 1  Change in appetite 0 0 1  Feeling bad or failure about yourself  0 0 3  Trouble  concentrating 0 0 0  Moving slowly or fidgety/restless 0 0 1  Suicidal thoughts 0 0 0  PHQ-9 Score 0 0 12  Difficult doing work/chores Not difficult at all Not difficult at all Somewhat difficult    BP Readings from Last 3 Encounters:  08/26/22 100/60  06/18/22 126/70  09/05/21 120/70    Physical Exam Vitals and nursing note reviewed.  HENT:     Head: Normocephalic.     Right Ear: Tympanic membrane and external ear normal.     Left Ear: Tympanic membrane and external ear normal.     Nose: Nose normal.     Mouth/Throat:     Pharynx: Oropharynx is clear.  Neck:     Thyroid: No thyromegaly.     Vascular: No JVD.     Trachea: No tracheal deviation.  Cardiovascular:     Rate and Rhythm: Normal rate and regular rhythm.     Heart sounds: Normal heart sounds. No murmur heard.    No friction rub. No gallop.  Pulmonary:     Effort: No respiratory distress.     Breath sounds: Normal breath sounds. No wheezing, rhonchi or rales.  Chest:     Chest wall: Tenderness present.  Abdominal:     General: Bowel sounds are normal.     Palpations: Abdomen is soft. There is no mass.     Tenderness: There is no abdominal tenderness. There is no guarding or rebound.  Musculoskeletal:        General: No tenderness. Normal range of motion.     Cervical back: Normal range of motion and neck supple.     Right lower leg: Edema present.     Left lower leg: No edema.  Lymphadenopathy:     Cervical: No cervical adenopathy.  Skin:    General: Skin is warm.     Findings: No rash.  Neurological:     Mental Status: He is alert and oriented to person, place, and time.     Cranial Nerves: No cranial nerve deficit.     Deep Tendon Reflexes: Reflexes are normal and symmetric.     Wt Readings from Last 3 Encounters:  08/26/22 227 lb (103 kg)  06/18/22 225 lb (102.1 kg)  09/05/21 217 lb (98.4 kg)    BP 100/60   Pulse 60   Ht 6\' 3"  (1.905 m)   Wt 227 lb (103 kg)   SpO2 99%   BMI 28.37 kg/m  Assessment and Plan:  1. Right leg swelling New onset.  Persistent.  Occurred about 2 weeks ago when patient had a fall getting out of the tub.  Circumference of the affected leg is an inch greater than the unaffected leg.  My concern is that there is been a bleed in the calf and there is some mild palpable tenderness but no pain with movement of the foot to suggest compartment syndrome but this needs to be watched as well as the remote possibility of a DVT given that patient has been relatively inactive and is sleeping in an upright position in a chair with leg down.  Have discussed with patient if he should become acutely short of breath that he is to go to the hospital immediately patient is to go to the hospital for further evaluation and this may be delayed for 12 hours until he can arrange transportation.  Elizabeth Sauer, MD

## 2022-08-27 ENCOUNTER — Encounter: Payer: Self-pay | Admitting: Family Medicine

## 2022-08-28 ENCOUNTER — Telehealth: Payer: Self-pay

## 2022-08-28 NOTE — Telephone Encounter (Signed)
I spoke to Christian Doyle after visit with pt and explained the reason behind the decision of going to ER for further eval. Of R) leg. I explained several things could be going on per Dr. Yetta Barre. 1) He could've torn a muscle during fall and may have bleeding in the leg. 2) He may have a clot that is causing the swelling and tenderness of his leg. 3) He could have a Fx from the fall. I also explained that it wasn't as simple as putting him on "lasix" because this could drop his pressure, as it is good, and that can lead to more falls. I also let her know that there was a 1 1/2- 2in difference in the R) leg as opposed to the L). Pt was advised to go to hospital, but told me he had to "go on a date, pick a girl up at 6 for a 7:00 movie." [name] seemed to disagree with that and I told her I would be looking at his chart to see if he went 08/27/22 as he promised

## 2022-12-15 ENCOUNTER — Telehealth: Payer: Self-pay | Admitting: Family Medicine

## 2022-12-15 NOTE — Telephone Encounter (Signed)
Copied from CRM [id number]. Topic: General - Inquiry >> Dec 15, 2022  3:59 PM Patsy Lager T wrote: Reason for CRM: Patient called stated his family was close to [place] and he needed her to call him on his cell phone in reference to the appt he has scheduled for 4/18. He wouldn't give any more info

## 2022-12-18 ENCOUNTER — Ambulatory Visit: Payer: Medicaid Other | Admitting: Family Medicine

## 2022-12-21 ENCOUNTER — Ambulatory Visit (INDEPENDENT_AMBULATORY_CARE_PROVIDER_SITE_OTHER): Payer: Medicaid Other

## 2022-12-21 ENCOUNTER — Ambulatory Visit
Admission: EM | Admit: 2022-12-21 | Discharge: 2022-12-21 | Disposition: A | Discharge status: Home / Self Care | Payer: Medicaid Other | Attending: Family Medicine | Admitting: Family Medicine

## 2022-12-21 DIAGNOSIS — L550 Sunburn of first degree: Secondary | ICD-10-CM | POA: Diagnosis not present

## 2022-12-21 DIAGNOSIS — M25561 Pain in right knee: Secondary | ICD-10-CM

## 2022-12-21 DIAGNOSIS — R600 Localized edema: Secondary | ICD-10-CM

## 2022-12-21 DIAGNOSIS — G8929 Other chronic pain: Secondary | ICD-10-CM

## 2022-12-21 MED ORDER — NAPROXEN 500 MG PO TABS: 500.0000 mg | ORAL_TABLET | Freq: Two times a day (BID) | ORAL | 0 refills | Status: DC

## 2022-12-21 MED ORDER — PREDNISONE 10 MG (21) PO TBPK: ORAL_TABLET | Freq: Every day | ORAL | 0 refills | Status: DC

## 2022-12-21 NOTE — Discharge Instructions (Addendum)
For knee pain: take Tylenol 1000 mg 2-3 times day. Take Naprosyn twice a day for pain. Continue wearing your knee brace for the next 2 weeks. If pain doesn't improve, follow up with Dr Joseph Berkshire.   For leg swelling: Speak with your primary care provider about an evaluation for your leg swelling. You may need to have evaluation of your  heart, kidneys and veins to determine the cause.   For sunburn: Take frequent cool baths or showers to help relieve the pain. As soon as you get out of the bathtub or shower, gently pat yourself dry, but leave a little water on your skin. Then, apply a moisturizer to help trap the water in your skin. This can help ease the dryness.  Use a moisturizer that contains aloe vera or soy to help soothe sunburned skin. If a particular area feels especially uncomfortable, you may want to apply a hydrocortisone cream that you can buy without a prescription. Do not treat sunburn with "-caine" products (such as benzocaine), as these may irritate the skin or cause an allergic reaction.  Consider taking aspirin or ibuprofen to help reduce any swelling, redness and discomfort.  Drink extra water. A sunburn draws fluid to the skin's surface and away from the rest of the body. Drinking extra water when you are sunburned helps prevent dehydration.  If your skin blisters, allow the blisters to heal. Blistering skin means you have a second-degree sunburn. You should not pop the blisters, as blisters form to help your skin heal and protect you from infection.  Take extra care to protect sunburned skin while it heals. Wear clothing that covers your skin when outdoors. Tightly-woven fabrics work best. When you hold the fabric up to a bright light, you shouldn't see any light coming through.

## 2022-12-21 NOTE — ED Provider Notes (Signed)
[facility]    CSN: [id number] Arrival date & time: 12/21/22  0806      History   Chief Complaint Chief Complaint  Patient presents with   Joint Swelling    HPI  HPI GIRARD KOONTZ is a 32 y.o. male.   Shepard presents for right knee pain on Thursday night. He was walking on his right knee gave out.  He has knee and ankle swelling, difficulty stretching knee out, and walking. He put some "horse stuff" on it and took ibuprofen without relief.  His pain is keeping him up at night. He has had knee problems since playing sports in high school. His knee pain has been present since he turned 32 years old. Has chronic lymphedema.  Mom reports patient has never had this worked up before.  Patient reports he was mowing grass for about 7 hours with his knees exposed.  He has lower leg sunburn.     Past Medical History:  Diagnosis Date   Drug abuse    Drug addiction in remission    Hypertension     Patient Active Problem List   Diagnosis Date Noted   Essential hypertension 10/03/2019   Chronic pain syndrome 06/19/2017   Opioid dependence in remission 06/19/2017    Past Surgical History:  Procedure Laterality Date   TONSILLECTOMY         Home Medications    Prior to Admission medications   Medication Sig Start Date End Date Taking? Authorizing Provider  lisinopril-hydrochlorothiazide (ZESTORETIC) 20-12.5 MG tablet Take 1 tablet by mouth daily. 06/18/22  Yes Duanne Limerick, MD  methadone (DOLOPHINE) 1 mg/ml oral solution Take 120 mg/kg by mouth daily.   Yes [provider]  naproxen (NAPROSYN) 500 MG tablet Take 1 tablet (500 mg total) by mouth 2 (two) times daily with a meal. 12/21/22  Yes Clee Pandit, DO  predniSONE (STERAPRED UNI-PAK 21 TAB) 10 MG (21) TBPK tablet Take by mouth daily. Take 6 tabs by mouth daily for 1, then 5 tabs for 1 day, then 4 tabs for 1 day, then 3 tabs for 1 day, then 2 tabs for 1 day, then 1 tab for 1 day. 12/21/22  Yes  Katha Cabal, DO    Family History Family History  Problem Relation Age of Onset   Healthy Mother    Healthy Father     Social History Social History   Tobacco Use   Smoking status: Every Day    Packs/day: 0.50    Years: 12.00    Additional pack years: 0.00    Total pack years: 6.00    Types: Cigarettes   Smokeless tobacco: Former  Building services engineer Use: Never used  Substance Use Topics   Alcohol use: Never   Drug use: Yes    Types: Marijuana, Heroin, Oxycodone    Comment: prescribed methadone     Allergies   Patient has no known allergies.   Review of Systems Review of Systems: :negative unless otherwise stated in HPI.      Physical Exam Triage Vital Signs ED Triage Vitals  Enc Vitals Group     BP 12/21/22 0820 110/65     Pulse Rate 12/21/22 0820 75     Resp 12/21/22 0820 16     Temp 12/21/22 0820 98.1 F (36.7 C)     Temp Source 12/21/22 0820 Oral     SpO2 12/21/22 0820 100 %     Weight 12/21/22 0819 220 lb (99.8  kg)     Height 12/21/22 0819 6\' 1"  (1.854 m)     Head Circumference --      Peak Flow --      Pain Score 12/21/22 0824 8     Pain Loc --      Pain Edu? --      Excl. in GC? --    No data found.  Updated Vital Signs BP 110/65 (BP Location: Left Arm)   Pulse 75   Temp 98.1 F (36.7 C) (Oral)   Resp 16   Ht 6\' 1"  (1.854 m)   Wt 99.8 kg   SpO2 100%   BMI 29.03 kg/m   Visual Acuity Right Eye Distance:   Left Eye Distance:   Bilateral Distance:    Right Eye Near:   Left Eye Near:    Bilateral Near:     Physical Exam GEN: well appearing male in no acute distress  CVS: well perfused  RESP: speaking in full sentences without pause, no respiratory distress  MSK:  Right Knee Exam -Inspection: no deformity, overlying redness without blistering c/w sunburn,  -Palpation: medial joint line tenderness, anterior patellar tenderness, moderate anterior effusion palpable -ROM: limited extension and flexion due to acute swelling.   - Strength 5/5 hip, knee and ankle  -Special Tests: Varus Stress: Negative; Valgus Stress: Negative; anterior drawer: Negative; Posterior drawer: Negative; Thessaly: not attempted Patellar grind: not attempted due to pain and edema  -Limb neurovascularly intact, no instability noted   UC Treatments / Results  Labs (all labs ordered are listed, but only abnormal results are displayed) Labs Reviewed - No data to display  EKG   Radiology DG Knee Complete 4 Views Right  Result Date: 12/21/2022 CLINICAL DATA:  32 year old male with history of right knee pain and edema after a fall. EXAM: RIGHT KNEE - COMPLETE 4+ VIEW COMPARISON:  No priors. FINDINGS: No evidence of fracture, dislocation, or joint effusion. No evidence of arthropathy or other focal bone abnormality. Suprapatellar joint effusion. IMPRESSION: 1. No acute osseous abnormality. 2. Suprapatellar joint effusion. Electronically Signed   By: Trudie Reed M.D.   On: 12/21/2022 08:53     Procedures Procedures (including critical care time)  Medications Ordered in UC Medications - No data to display  Initial Impression / Assessment and Plan / UC Course  I have reviewed the triage vital signs and the nursing notes.  Pertinent labs & imaging results that were available during my care of the patient were reviewed by me and considered in my medical decision making (see chart for details).      Pt is a 32 y.o.  male with acute on chronic right knee pain with edema. On exam, pt has medial joint line and anterior tenderness with effusion.   Obtained right knee plain films.  Personally interpreted by me were unremarkable for fracture or dislocation. Radiologist report reviewed and additionally notes suprapatellar joint effusion.  Offered pain control here but he declined.  Given right knee brace prior to discharge. Patient to gradually return to normal activities, as tolerated and continue ordinary activities within the limits permitted  by pain. Prescribed Naproxen sodium  and prednisone.  Tylenol PRN. Advised patient to avoid OTC NSAIDs while taking prescription NSAID. Counseled patient on red flag symptoms and when to seek immediate care.  Patient to follow up with orthopedic provider, if symptoms do not improve with conservative treatment.  Patient with chronic bilateral lower extremity edema.   On chart review, I do not  see where an echo has been performed.  On lab review, in October 2023 his sodium was 130 I wonder if this is due to hypervolemia.  His serum creatinine was normal at that time.  His last TSH was in January 2023 and it was normal. Advised him to follow-up with his primary care provider as he may need kidney, cardiac and vascular evaluation.   He was provided with sunburn care instructions on his AVS.   Return and ED precautions given. Understanding voiced. Discussed MDM, treatment plan and plan for follow-up with patient and his mom who agree with plan.   Final Clinical Impressions(s) / UC Diagnoses   Final diagnoses:  Chronic pain of right knee  Sunburn of first degree  Bilateral lower extremity edema     Discharge Instructions      For knee pain: take Tylenol 1000 mg 2-3 times day. Take Naprosyn twice a day for pain. Continue wearing your knee brace for the next 2 weeks. If pain doesn't improve, follow up with Dr Joseph Berkshire.   For leg swelling: Speak with your primary care provider about an evaluation for your leg swelling. You may need to have evaluation of your  heart, kidneys and veins to determine the cause.   For sunburn: Take frequent cool baths or showers to help relieve the pain. As soon as you get out of the bathtub or shower, gently pat yourself dry, but leave a little water on your skin. Then, apply a moisturizer to help trap the water in your skin. This can help ease the dryness.  Use a moisturizer that contains aloe vera or soy to help soothe sunburned skin. If a particular area feels  especially uncomfortable, you may want to apply a hydrocortisone cream that you can buy without a prescription. Do not treat sunburn with "-caine" products (such as benzocaine), as these may irritate the skin or cause an allergic reaction.  Consider taking aspirin or ibuprofen to help reduce any swelling, redness and discomfort.  Drink extra water. A sunburn draws fluid to the skin's surface and away from the rest of the body. Drinking extra water when you are sunburned helps prevent dehydration.  If your skin blisters, allow the blisters to heal. Blistering skin means you have a second-degree sunburn. You should not pop the blisters, as blisters form to help your skin heal and protect you from infection.  Take extra care to protect sunburned skin while it heals. Wear clothing that covers your skin when outdoors. Tightly-woven fabrics work best. When you hold the fabric up to a bright light, you shouldn't see any light coming through.     ED Prescriptions     Medication Sig Dispense Auth. Provider   predniSONE (STERAPRED UNI-PAK 21 TAB) 10 MG (21) TBPK tablet Take by mouth daily. Take 6 tabs by mouth daily for 1, then 5 tabs for 1 day, then 4 tabs for 1 day, then 3 tabs for 1 day, then 2 tabs for 1 day, then 1 tab for 1 day. 21 tablet Lucky Trotta, DO   naproxen (NAPROSYN) 500 MG tablet Take 1 tablet (500 mg total) by mouth 2 (two) times daily with a meal. 30 tablet Kyley Solow, DO      PDMP not reviewed this encounter.   Katha Cabal, DO 12/21/22 [phone]

## 2022-12-21 NOTE — ED Triage Notes (Signed)
Pt c/o R knee edema x3 days, due to fall. States he was walking & leg gave out. Hx of lymphedema.

## 2023-01-17 ENCOUNTER — Other Ambulatory Visit: Payer: Self-pay | Admitting: Family Medicine

## 2023-01-17 DIAGNOSIS — I10 Essential (primary) hypertension: Secondary | ICD-10-CM

## 2023-01-22 ENCOUNTER — Other Ambulatory Visit: Payer: Self-pay | Admitting: Family Medicine

## 2023-01-22 DIAGNOSIS — I10 Essential (primary) hypertension: Secondary | ICD-10-CM

## 2023-01-22 NOTE — Telephone Encounter (Signed)
Requested medication (s) are due for refill today: signed 01/19/23  Requested medication (s) are on the active medication list: yes   Last refill:  01/19/23 #30  refills  Future visit scheduled: no   Notes to clinic:  do you want to reorder Rx? Receipt confirmed by pharmacy 01/19/23 at 1:06 pm. Attempted to contact pharmacy to review request. No answer, unable to speak to pharmacy staff.     Requested Prescriptions  Pending Prescriptions Disp Refills   lisinopril-hydrochlorothiazide (ZESTORETIC) 20-12.5 MG tablet [Pharmacy Med Name: Lisinopril-hydroCHLOROthiazide 20-12.5 MG Oral Tablet] 90 tablet 0    Sig: Take 1 tablet by mouth once daily     Cardiovascular:  ACEI + Diuretic Combos Failed - 01/22/2023  4:34 PM      Failed - Na in normal range and within 180 days    Sodium  Date Value Ref Range Status  06/18/2022 130 (L) 135 - 145 mmol/L Final  10/03/2019 140 134 - 144 mmol/L Final  10/28/2012 138 136 - 145 mmol/L Final         Failed - K in normal range and within 180 days    Potassium  Date Value Ref Range Status  06/18/2022 3.9 3.5 - 5.1 mmol/L Final  10/28/2012 4.0 3.5 - 5.1 mmol/L Final         Failed - Cr in normal range and within 180 days    Creat  Date Value Ref Range Status  01/09/2020 0.96 0.60 - 1.35 mg/dL Final   Creatinine, Ser  Date Value Ref Range Status  06/18/2022 0.90 0.61 - 1.24 mg/dL Final         Failed - eGFR is 30 or above and within 180 days    EGFR (African American)  Date Value Ref Range Status  10/28/2012 >60  Final   GFR calc Af Amer  Date Value Ref Range Status  10/03/2019 115 >59 mL/min/1.73 Final   EGFR (Non-African Amer.)  Date Value Ref Range Status  10/28/2012 >60  Final    Comment:    eGFR values <91mL/min/1.73 m2 may be an indication of chronic kidney disease (CKD). Calculated eGFR is useful in patients with stable renal function. The eGFR calculation will not be reliable in acutely ill patients when serum creatinine is  changing rapidly. It is not useful in  patients on dialysis. The eGFR calculation may not be applicable to patients at the low and high extremes of body sizes, pregnant women, and vegetarians.    GFR, Estimated  Date Value Ref Range Status  06/18/2022 >60 >60 mL/min Final    Comment:    (NOTE) Calculated using the CKD-EPI Creatinine Equation (2021)          Passed - Patient is not pregnant      Passed - Last BP in normal range    BP Readings from Last 1 Encounters:  12/21/22 110/65         Passed - Valid encounter within last 6 months    Recent Outpatient Visits           4 months ago Right leg swelling   [facility]   7 months ago Essential hypertension   [facility]   1 year ago New onset seizure [facility])   [facility]   1 year  ago Essential hypertension   [facility] [facility]   2 years ago Nonallopathic lesion of costochondral region   [facility]

## 2023-01-22 NOTE — Telephone Encounter (Signed)
Called pharmacy [phone] to clarify medication request. Receipt confirmed by pharmacy 01/19/23 at 1:06 pm in review of chart. Rang multiple rings no answer to speak to pharmacy staff.

## 2023-01-23 NOTE — Telephone Encounter (Signed)
Left voice mail to set up medication refill appt. 

## 2023-01-27 ENCOUNTER — Telehealth: Payer: Self-pay | Admitting: Family Medicine

## 2023-01-27 NOTE — Telephone Encounter (Signed)
Copied from CRM [id number]. Topic: Referral - Request for Referral >> Jan 27, 2023 10:09 AM Christian Doyle wrote: Pt's mother call saying her son needs a referral for his knee and sweeling in his legs.  She says Dr. Yetta Barre has already told them he may need one.  He has medicaid.  CB#  [phone]

## 2023-01-29 ENCOUNTER — Ambulatory Visit (INDEPENDENT_AMBULATORY_CARE_PROVIDER_SITE_OTHER): Payer: Medicaid Other | Admitting: Family Medicine

## 2023-01-29 ENCOUNTER — Encounter: Payer: Self-pay | Admitting: Family Medicine

## 2023-01-29 VITALS — BP 118/78 | HR 64 | Ht 73.0 in | Wt 222.0 lb

## 2023-01-29 DIAGNOSIS — I10 Essential (primary) hypertension: Secondary | ICD-10-CM

## 2023-01-29 DIAGNOSIS — I872 Venous insufficiency (chronic) (peripheral): Secondary | ICD-10-CM | POA: Diagnosis not present

## 2023-01-29 MED ORDER — LISINOPRIL-HYDROCHLOROTHIAZIDE 20-12.5 MG PO TABS: 1.0000 | ORAL_TABLET | Freq: Every day | ORAL | 1 refills | Status: DC

## 2023-01-29 NOTE — Progress Notes (Signed)
Date:  01/29/2023   Name:  Christian Doyle   DOB:  1991/07/09   MRN:  [id number]   Chief Complaint: Edema and Hypertension  Hypertension This is a chronic problem. The problem has been gradually improving since onset. The problem is controlled. Pertinent negatives include no blurred vision, chest pain, orthopnea, palpitations, PND or shortness of breath. There are no associated agents to hypertension. Past treatments include ACE inhibitors and diuretics. The current treatment provides moderate improvement. There are no compliance problems.  There is no history of angina, kidney disease, CAD/MI or left ventricular hypertrophy. There is no history of chronic renal disease, a hypertension causing med or renovascular disease.    Lab Results  Component Value Date   NA 130 (L) 06/18/2022   K 3.9 06/18/2022   CO2 27 06/18/2022   GLUCOSE 102 (H) 06/18/2022   BUN 16 06/18/2022   CREATININE 0.90 06/18/2022   CALCIUM 8.6 (L) 06/18/2022   GFRNONAA >60 06/18/2022   Lab Results  Component Value Date   CHOL 183 06/18/2022   HDL 52 06/18/2022   LDLCALC 125 (H) 06/18/2022   TRIG 30 06/18/2022   CHOLHDL 3.5 06/18/2022   Lab Results  Component Value Date   TSH 1.08 10/28/2012   No results found for: "HGBA1C" Lab Results  Component Value Date   WBC 10.4 09/02/2021   HGB 13.5 09/02/2021   HCT 39.8 09/02/2021   MCV 91.1 09/02/2021   PLT 185 09/02/2021   Lab Results  Component Value Date   ALT 41 09/02/2021   AST 39 09/02/2021   GGT 137 (H) 11/08/2019   ALKPHOS 58 09/02/2021   BILITOT 0.5 09/02/2021   No results found for: "25OHVITD2", "25OHVITD3", "VD25OH"   Review of Systems  Constitutional:  Negative for diaphoresis and fever.  HENT:  Negative for sinus pressure.   Eyes:  Negative for blurred vision and visual disturbance.  Respiratory:  Negative for cough, chest tightness, shortness of breath and wheezing.   Cardiovascular:  Positive for leg swelling. Negative for chest  pain, palpitations, orthopnea and PND.  Gastrointestinal:  Negative for abdominal pain.  Endocrine: Negative for polydipsia and polyuria.  Genitourinary:  Negative for difficulty urinating.  Musculoskeletal:  Positive for arthralgias.    Patient Active Problem List   Diagnosis Date Noted   Essential hypertension 10/03/2019   Chronic pain syndrome 06/19/2017   Opioid dependence in remission (HCC) 06/19/2017    No Known Allergies  Past Surgical History:  Procedure Laterality Date   TONSILLECTOMY      Social History   Tobacco Use   Smoking status: Every Day    Packs/day: 0.50    Years: 12.00    Additional pack years: 0.00    Total pack years: 6.00    Types: Cigarettes   Smokeless tobacco: Former  Building services engineer Use: Never used  Substance Use Topics   Alcohol use: Never   Drug use: Yes    Types: Marijuana, Heroin, Oxycodone    Comment: prescribed methadone     Medication list has been reviewed and updated.  Current Meds  Medication Sig   lisinopril-hydrochlorothiazide (ZESTORETIC) 20-12.5 MG tablet Take 1 tablet by mouth once daily   methadone (DOLOPHINE) 1 mg/ml oral solution Take 120 mg/kg by mouth daily.   naproxen (NAPROSYN) 500 MG tablet Take 1 tablet (500 mg total) by mouth 2 (two) times daily with a meal.   [DISCONTINUED] predniSONE (STERAPRED UNI-PAK 21 TAB) 10 MG (21) TBPK tablet  Take by mouth daily. Take 6 tabs by mouth daily for 1, then 5 tabs for 1 day, then 4 tabs for 1 day, then 3 tabs for 1 day, then 2 tabs for 1 day, then 1 tab for 1 day.       01/29/2023    4:12 PM 08/26/2022    2:29 PM 06/18/2022    8:46 AM 09/05/2021   11:05 AM  GAD 7 : Generalized Anxiety Score  Nervous, Anxious, on Edge 0 0 0 1  Control/stop worrying 0 0 0 0  Worry too much - different things 0 0 0 1  Trouble relaxing 0 0 0 1  Restless 0 0 0 0  Easily annoyed or irritable 0 0 0 0  Afraid - awful might happen 0 0 0 0  Total GAD 7 Score 0 0 0 3  Anxiety Difficulty Not  difficult at all Not difficult at all Not difficult at all Not difficult at all       01/29/2023    4:12 PM 08/26/2022    2:29 PM 06/18/2022    8:46 AM  Depression screen PHQ 2/9  Decreased Interest 0 0 0  Down, Depressed, Hopeless 0 0 0  PHQ - 2 Score 0 0 0  Altered sleeping 0 0 0  Tired, decreased energy 0 0 0  Change in appetite 0 0 0  Feeling bad or failure about yourself  0 0 0  Trouble concentrating 0 0 0  Moving slowly or fidgety/restless 0 0 0  Suicidal thoughts 0 0 0  PHQ-9 Score 0 0 0  Difficult doing work/chores Not difficult at all Not difficult at all Not difficult at all    BP Readings from Last 3 Encounters:  01/29/23 118/78  12/21/22 110/65  08/26/22 100/60    Physical Exam Vitals and nursing note reviewed.  HENT:     Head: Normocephalic.     Right Ear: Tympanic membrane normal.     Left Ear: Tympanic membrane normal.     Nose: Nose normal. No congestion or rhinorrhea.     Mouth/Throat:     Mouth: Mucous membranes are moist.  Cardiovascular:     Pulses: Normal pulses.     Heart sounds: No murmur heard.    No gallop.     Comments: Right 17 cm/left 16.5 Pulmonary:     Breath sounds: No wheezing, rhonchi or rales.  Abdominal:     Tenderness: There is no abdominal tenderness.  Musculoskeletal:     Right lower leg: 1+ Pitting Edema present.     Left lower leg: 1+ Pitting Edema present.  Neurological:     Mental Status: He is alert.     Wt Readings from Last 3 Encounters:  01/29/23 222 lb (100.7 kg)  12/21/22 220 lb (99.8 kg)  08/26/22 227 lb (103 kg)    BP 118/78   Pulse 64   Ht 6\' 1"  (1.854 m)   Wt 222 lb (100.7 kg)   SpO2 98%   BMI 29.29 kg/m   Assessment and Plan:  1. Essential hypertension Chronic.  Controlled.  Stable.  Blood pressure today is 118/78.  Asymptomatic.  Tolerating medications well.  Continue lisinopril hydrochlorothiazide 20-12.5 mg once a day.  Will check CMP for electrolytes and GFR.  Will recheck patient in 6  months. - lisinopril-hydrochlorothiazide (ZESTORETIC) 20-12.5 MG tablet; Take 1 tablet by mouth daily.  Dispense: 90 tablet; Refill: 1 - Comprehensive Metabolic Panel (CMET)  2. Venous insufficiency of right leg  Chronic.  Persistent.  Right greater than the left.  This is consistent with venous insufficiency and that patient does not sleep in the supine prone position but is oftentimes in a chair.  Patient has done better with elevating leg.  Given the persistent nature of the swelling and likelihood of venous insufficiency will have formal evaluation with vascular surgery and treatment thereupon. - Ambulatory referral to Vascular Surgery    Elizabeth Sauer, MD

## 2023-01-30 ENCOUNTER — Encounter: Payer: Self-pay | Admitting: Family Medicine

## 2023-01-30 LAB — COMPREHENSIVE METABOLIC PANEL
ALT: 19 IU/L (ref 0–44)
AST: 20 IU/L (ref 0–40)
Albumin/Globulin Ratio: 1.6 (ref 1.2–2.2)
Albumin: 4.2 g/dL (ref 4.1–5.1)
Alkaline Phosphatase: 92 IU/L (ref 44–121)
BUN/Creatinine Ratio: 15 (ref 9–20)
BUN: 14 mg/dL (ref 6–20)
Bilirubin Total: <0.2 mg/dL (ref 0.0–1.2)
CO2: 27 mmol/L (ref 20–29)
Calcium: 9.5 mg/dL (ref 8.7–10.2)
Chloride: 102 mmol/L (ref 96–106)
Creatinine, Ser: 0.93 mg/dL (ref 0.76–1.27)
Globulin, Total: 2.7 g/dL (ref 1.5–4.5)
Glucose: 55 mg/dL — ABNORMAL LOW (ref 70–99)
Potassium: 4.1 mmol/L (ref 3.5–5.2)
Sodium: 142 mmol/L (ref 134–144)
Total Protein: 6.9 g/dL (ref 6.0–8.5)
eGFR: 112 mL/min/{1.73_m2} (ref >59)

## 2023-02-03 ENCOUNTER — Ambulatory Visit: Payer: Self-pay | Admitting: *Deleted

## 2023-02-03 ENCOUNTER — Telehealth: Payer: Self-pay

## 2023-02-03 DIAGNOSIS — M25461 Effusion, right knee: Secondary | ICD-10-CM | POA: Insufficient documentation

## 2023-02-03 DIAGNOSIS — M25361 Other instability, right knee: Secondary | ICD-10-CM | POA: Insufficient documentation

## 2023-02-03 NOTE — Telephone Encounter (Signed)
Summary: fallen twice/severe pain   Pt mother stated Dr. Yetta Barre mentioned that pt may have a torn meniscus stated pt has fallen twice due to severe pain. Pt is not with her, mother is requesting an Rx for a small brace with metal.  Stated pt is at work, and if the nurse cannot reach him, please call her back. Seeking clinical advice.           Chief Complaint: right knee severe pain fell x 2 unable to walk Symptoms: severe pain fell x 2 unable to put weight on right knee. Sharp pain swelling of leg to foot like a balloon.  Frequency: today  Pertinent Negatives: Patient denies fever Disposition: [] ED /[] Urgent Care (no appt availability in office) / [] Appointment(In office/virtual)/ []  [facility]/ [] Home Care/ [x] Refused Recommended Disposition /[] [place] [facility]/ []  Follow-up with PCP Additional Notes:   Recommended ED due to unable to walk. Patient declined due to already seen in ED. [facility]commended [facility]. Patient would like a call back from PCP or to call his mother back to review issues. Please advise     Reason for Disposition  Patient sounds very sick or weak to the triager  Answer Assessment - Initial Assessment Questions 1. LOCATION and RADIATION: "Where is the pain located?"      Right knee 2. QUALITY: "What does the pain feel like?"  (e.g., sharp, dull, aching, burning)     Sharp leg swelling like a balloon 3. SEVERITY: "How bad is the pain?" "What does it keep you from doing?"   (Scale 1-10; or mild, moderate, severe)   -  MILD (1-3): doesn't interfere with normal activities    -  MODERATE (4-7): interferes with normal activities (e.g., work or school) or awakens from sleep, limping    -  SEVERE (8-10): excruciating pain, unable to do any normal activities, unable to walk     Unable to walk 4. ONSET: "When did the pain start?" "Does it come and go, or is it there all the time?"     Today fell x 2 5. RECURRENT: "Have you had this pain before?" If  Yes, ask: "When, and what happened then?"     Yes  6. SETTING: "Has there been any recent work, exercise or other activity that involved that part of the body?"      Na  7. AGGRAVATING FACTORS: "What makes the knee pain worse?" (e.g., walking, climbing stairs, running)     Na  8. ASSOCIATED SYMPTOMS: "Is there any swelling or redness of the knee?"     Swelling from foot to knee 9. OTHER SYMPTOMS: "Do you have any other symptoms?" (e.g., chest pain, difficulty breathing, fever, calf pain)     Leg swollen and foot .  10. PREGNANCY: "Is there any chance you are pregnant?" "When was your last menstrual period?"       na  Protocols used: Knee Pain-A-AH

## 2023-02-03 NOTE — Telephone Encounter (Signed)
Called pt's mom back- gave her telephone numbers to both [facility] [place] and [place]- pt needs to go get evaluated further for knee pain

## 2023-02-21 ENCOUNTER — Other Ambulatory Visit: Payer: Self-pay | Admitting: Family Medicine

## 2023-02-21 DIAGNOSIS — I10 Essential (primary) hypertension: Secondary | ICD-10-CM

## 2023-03-02 ENCOUNTER — Encounter (INDEPENDENT_AMBULATORY_CARE_PROVIDER_SITE_OTHER): Payer: Self-pay | Admitting: Nurse Practitioner

## 2023-03-02 ENCOUNTER — Ambulatory Visit (INDEPENDENT_AMBULATORY_CARE_PROVIDER_SITE_OTHER): Payer: MEDICAID | Admitting: Nurse Practitioner

## 2023-03-02 VITALS — BP 104/66 | HR 60 | Resp 18 | Ht 73.0 in | Wt 227.8 lb

## 2023-03-02 DIAGNOSIS — M7989 Other specified soft tissue disorders: Secondary | ICD-10-CM

## 2023-03-02 DIAGNOSIS — M25361 Other instability, right knee: Secondary | ICD-10-CM

## 2023-03-03 NOTE — Progress Notes (Signed)
Subjective:    Patient ID: Christian Doyle, male    DOB: May 10, 1991, 32 y.o.   MRN: [id number] Chief Complaint  Patient presents with   New Patient (Initial Visit)    np. consult. venous insufficiency/ edema of lower extremities- worse in Right. referred by jones, deanna.    Patient is seen for evaluation of leg swelling. The patient first noticed the swelling remotely but is now concerned because of a significant increase in the overall edema. The swelling isn't associated with significant pain.  There has been an increasing amount of  discoloration noted by the patient. The patient notes that in the morning the legs are improved but they steadily worsened throughout the course of the day. Elevation seems to make the swelling of the legs better, dependency makes them much worse.   There is no history of ulcerations associated with the swelling.   The patient denies any recent changes in their medications.  The patient has not been wearing graduated compression.  The patient has no had any past angiography, interventions or vascular surgery.  The patient denies a history of DVT or PE. There is no prior history of phlebitis. There is no history of primary lymphedema.  There is no history of radiation treatment to the groin or pelvis No history of malignancies. No history of trauma or groin or pelvic surgery. No history of foreign travel or parasitic infections area      Review of Systems  Cardiovascular:  Positive for leg swelling.  Musculoskeletal:  Positive for arthralgias.  All other systems reviewed and are negative.      Objective:   Physical Exam Vitals reviewed.  HENT:     Head: Normocephalic.  Cardiovascular:     Rate and Rhythm: Normal rate.     Pulses: Normal pulses.  Pulmonary:     Effort: Pulmonary effort is normal.  Musculoskeletal:     Right lower leg: 1+ Pitting Edema present.     Left lower leg: 1+ Pitting Edema present.  Skin:    General: Skin is  warm and dry.  Neurological:     Mental Status: He is alert and oriented to person, place, and time.     Gait: Gait abnormal.  Psychiatric:        Mood and Affect: Mood normal.        Behavior: Behavior normal.        Thought Content: Thought content normal.        Judgment: Judgment normal.     BP 104/66 (BP Location: Right Arm)   Pulse 60   Resp 18   Ht 6\' 1"  (1.854 m)   Wt 227 lb 12.8 oz (103.3 kg)   BMI 30.05 kg/m   Past Medical History:  Diagnosis Date   Drug abuse (HCC)    Drug addiction in remission (HCC)    Hypertension     Social History   Socioeconomic History   Marital status: Single    Spouse name: Not on file   Number of children: Not on file   Years of education: Not on file   Highest education level: Not on file  Occupational History   Not on file  Tobacco Use   Smoking status: Every Day    Packs/day: 0.50    Years: 12.00    Additional pack years: 0.00    Total pack years: 6.00    Types: Cigarettes   Smokeless tobacco: Former  Building services engineer Use: Never used  Substance and Sexual Activity   Alcohol use: Never   Drug use: Yes    Types: Marijuana, Heroin, Oxycodone    Comment: prescribed methadone   Sexual activity: Yes  Other Topics Concern   Not on file  Social History Narrative   Patient is on Methadone for substance abuse. [facility] [place], [place]. Patient Sober 4 years. Living with parents now.    Social Determinants of Health   Financial Resource Strain: Not on file  Food Insecurity: Not on file  Transportation Needs: Not on file  Physical Activity: Not on file  Stress: Not on file  Social Connections: Not on file  Intimate Partner Violence: Not on file    Past Surgical History:  Procedure Laterality Date   TONSILLECTOMY      Family History  Problem Relation Age of Onset   Healthy Mother    Healthy Father     No Known Allergies     Latest Ref Rng & Units 09/02/2021    4:09 PM 11/08/2019    9:23 AM 10/28/2012     8:31 PM  CBC  WBC 4.0 - 10.5 K/uL 10.4  7.7  8.4   Hemoglobin 13.0 - 17.0 g/dL 16.1  09.6  04.5   Hematocrit 39.0 - 52.0 % 39.8  40.4  45.1   Platelets 150 - 400 K/uL 185  176  176       CMP     Component Value Date/Time   NA 142 01/29/2023 1659   NA 138 10/28/2012 2031   K 4.1 01/29/2023 1659   K 4.0 10/28/2012 2031   CL 102 01/29/2023 1659   CL 104 10/28/2012 2031   CO2 27 01/29/2023 1659   CO2 26 10/28/2012 2031   GLUCOSE 55 (L) 01/29/2023 1659   GLUCOSE 102 (H) 06/18/2022 0940   GLUCOSE 89 10/28/2012 2031   BUN 14 01/29/2023 1659   BUN 14 10/28/2012 2031   CREATININE 0.93 01/29/2023 1659   CREATININE 0.96 01/09/2020 1536   CALCIUM 9.5 01/29/2023 1659   CALCIUM 9.6 10/28/2012 2031   PROT 6.9 01/29/2023 1659   PROT 7.9 10/28/2012 2031   ALBUMIN 4.2 01/29/2023 1659   ALBUMIN 4.5 10/28/2012 2031   AST 20 01/29/2023 1659   AST 32 10/28/2012 2031   ALT 19 01/29/2023 1659   ALT 62 (H) 11/08/2019 0923   ALKPHOS 92 01/29/2023 1659   ALKPHOS 63 10/28/2012 2031   BILITOT <0.2 01/29/2023 1659   BILITOT 0.8 10/28/2012 2031   EGFR 112 01/29/2023 1659   GFRNONAA >60 06/18/2022 0940   GFRNONAA >60 10/28/2012 2031     No results found.     Assessment & Plan:   1. Leg swelling Recommend:  I have had a long discussion with the patient regarding swelling and why it  causes symptoms.  Patient will begin wearing graduated compression on a daily basis . The patient will  wear the stockings first thing in the morning and removing them in the evening. The patient is instructed specifically not to sleep in the stockings.   In addition, behavioral modification will be initiated.  This will include frequent elevation, use of over the counter pain medications and exercise such as walking.  Consideration for a lymph pump will also be made based upon the effectiveness of conservative therapy.  This would help to improve the edema control and prevent sequela such as ulcers and  infections   Patient should undergo duplex ultrasound of the venous system to  ensure that DVT or reflux is not present.  The patient will follow-up with me after the ultrasound.   2. Instability of right knee joint Patient will continue to follow-up with orthopedic surgery   Current Outpatient Medications on File Prior to Visit  Medication Sig Dispense Refill   lisinopril-hydrochlorothiazide (ZESTORETIC) 20-12.5 MG tablet Take 1 tablet by mouth once daily 90 tablet 0   methadone (DOLOPHINE) 1 mg/ml oral solution Take 120 mg/kg by mouth daily.     naproxen (NAPROSYN) 500 MG tablet Take 1 tablet (500 mg total) by mouth 2 (two) times daily with a meal. 30 tablet 0   No current facility-administered medications on file prior to visit.    There are no Patient Instructions on file for this visit. No follow-ups on file.   Georgiana Spinner, NP

## 2023-03-11 ENCOUNTER — Other Ambulatory Visit (INDEPENDENT_AMBULATORY_CARE_PROVIDER_SITE_OTHER): Payer: Self-pay | Admitting: Nurse Practitioner

## 2023-03-11 DIAGNOSIS — M7989 Other specified soft tissue disorders: Secondary | ICD-10-CM

## 2023-03-23 ENCOUNTER — Ambulatory Visit (INDEPENDENT_AMBULATORY_CARE_PROVIDER_SITE_OTHER): Payer: MEDICAID | Admitting: Vascular Surgery

## 2023-03-23 ENCOUNTER — Encounter (INDEPENDENT_AMBULATORY_CARE_PROVIDER_SITE_OTHER): Payer: MEDICAID

## 2023-03-23 ENCOUNTER — Encounter (INDEPENDENT_AMBULATORY_CARE_PROVIDER_SITE_OTHER): Payer: Self-pay

## 2023-03-25 ENCOUNTER — Emergency Department
Admission: EM | Admit: 2023-03-25 | Discharge: 2023-03-25 | Disposition: A | Discharge status: Home / Self Care | Payer: MEDICAID | Attending: Emergency Medicine | Admitting: Emergency Medicine

## 2023-03-25 ENCOUNTER — Ambulatory Visit (INDEPENDENT_AMBULATORY_CARE_PROVIDER_SITE_OTHER): Payer: MEDICAID

## 2023-03-25 ENCOUNTER — Emergency Department: Payer: MEDICAID

## 2023-03-25 ENCOUNTER — Other Ambulatory Visit: Payer: Self-pay

## 2023-03-25 DIAGNOSIS — M7989 Other specified soft tissue disorders: Secondary | ICD-10-CM

## 2023-03-25 DIAGNOSIS — R0781 Pleurodynia: Secondary | ICD-10-CM | POA: Diagnosis not present

## 2023-03-25 DIAGNOSIS — I82451 Acute embolism and thrombosis of right peroneal vein: Secondary | ICD-10-CM | POA: Diagnosis not present

## 2023-03-25 DIAGNOSIS — M79604 Pain in right leg: Secondary | ICD-10-CM | POA: Diagnosis present

## 2023-03-25 DIAGNOSIS — I10 Essential (primary) hypertension: Secondary | ICD-10-CM | POA: Insufficient documentation

## 2023-03-25 LAB — CBC WITH DIFFERENTIAL/PLATELET
Abs Immature Granulocytes: 0.03 10*3/uL (ref 0.00–0.07)
Basophils Absolute: 0 10*3/uL (ref 0.0–0.1)
Basophils Relative: 0 %
Eosinophils Absolute: 0.2 10*3/uL (ref 0.0–0.5)
Eosinophils Relative: 3 %
HCT: 36.5 % — ABNORMAL LOW (ref 39.0–52.0)
Hemoglobin: 11.9 g/dL — ABNORMAL LOW (ref 13.0–17.0)
Immature Granulocytes: 0 %
Lymphocytes Relative: 29 %
Lymphs Abs: 2.1 10*3/uL (ref 0.7–4.0)
MCH: 30 pg (ref 26.0–34.0)
MCHC: 32.6 g/dL (ref 30.0–36.0)
MCV: 91.9 fL (ref 80.0–100.0)
Monocytes Absolute: 0.6 10*3/uL (ref 0.1–1.0)
Monocytes Relative: 8 %
Neutro Abs: 4.4 10*3/uL (ref 1.7–7.7)
Neutrophils Relative %: 60 %
Platelets: 154 10*3/uL (ref 150–400)
RBC: 3.97 MIL/uL — ABNORMAL LOW (ref 4.22–5.81)
RDW: 14.6 % (ref 11.5–15.5)
WBC: 7.3 10*3/uL (ref 4.0–10.5)
nRBC: 0 % (ref 0.0–0.2)

## 2023-03-25 LAB — BASIC METABOLIC PANEL
Anion gap: 6 (ref 5–15)
BUN: 16 mg/dL (ref 6–20)
CO2: 27 mmol/L (ref 22–32)
Calcium: 8.8 mg/dL — ABNORMAL LOW (ref 8.9–10.3)
Chloride: 104 mmol/L (ref 98–111)
Creatinine, Ser: 0.94 mg/dL (ref 0.61–1.24)
GFR, Estimated: >60 mL/min (ref >60)
Glucose, Bld: 97 mg/dL (ref 70–99)
Potassium: 4.2 mmol/L (ref 3.5–5.1)
Sodium: 137 mmol/L (ref 135–145)

## 2023-03-25 LAB — TROPONIN I (HIGH SENSITIVITY): Troponin I (High Sensitivity): <2 ng/L (ref <18)

## 2023-03-25 SURGERY — PULMONARY THROMBECTOMY
Anesthesia: Moderate Sedation

## 2023-03-25 MED ORDER — APIXABAN (ELIQUIS) VTE STARTER PACK (10MG AND 5MG): ORAL_TABLET | ORAL | 0 refills | Status: DC

## 2023-03-25 MED ORDER — KETOROLAC TROMETHAMINE 15 MG/ML IJ SOLN
15.0000 mg | Freq: Once | INTRAMUSCULAR | Status: AC
  Administered 2023-03-25: 15 mg via INTRAVENOUS
  Filled 2023-03-25: qty 1

## 2023-03-25 MED ORDER — IOHEXOL 350 MG/ML SOLN
75.0000 mL | Freq: Once | INTRAVENOUS | Status: AC | PRN
  Administered 2023-03-25: 75 mL via INTRAVENOUS

## 2023-03-25 NOTE — ED Notes (Signed)
Pharmacist Ron educated pt on eliquis

## 2023-03-25 NOTE — Progress Notes (Signed)
Patients CTA of the chest was negative for PE. Patient was started on Eliquis and instructed by pharmacy. Patient will follow up in [facility] in 1 month for repeat right lower extremity Ultrasound of Popliteal DVT.

## 2023-03-25 NOTE — ED Triage Notes (Signed)
Pt arrives via POV. Pt reports he has having right sided chest pain for a week, pain and swelling to lower extremities. Reports pain with a deep breathe. Pt had an [place] in both legs this morning. He states he has a blood clot in the right leg and that the provider sent him to the ER for concerns of a blood clot in his lung.

## 2023-03-25 NOTE — ED Notes (Signed)
Pt returns from CT.

## 2023-03-25 NOTE — Consult Note (Signed)
Hospital Consult    Reason for Consult:  Right Lower extremity DVT with Possible PE Requesting Physician:  Dr Chesley Noon MD.  MRN #:  [id number]  History of Present Illness: This is a 32 y.o. male who presented to [facility] ER after being seen in Vein and Vascular clinic earlier this morning. He had an ultrasound which was positive for a right lower extremity popliteal DVT. He also endorsed pain upon inspiration and coughing of the right side of his chest. Endorses this started about 2 weeks ago.   On exam he rests comfortably in the stretcher. No shortness of breath at rest. Saturations are 99-100 % at rest and room air. Patient denies any chest pain, dizziness. Denies any recent long term travel or trauma. No other complaints and vitals all remain stable.    Past Medical History:  Diagnosis Date   Drug abuse (HCC)    Drug addiction in remission [facility])    Hypertension     Past Surgical History:  Procedure Laterality Date   TONSILLECTOMY      No Known Allergies  Prior to Admission medications   Medication Sig Start Date End Date Taking? Authorizing Provider  lisinopril-hydrochlorothiazide (ZESTORETIC) 20-12.5 MG tablet Take 1 tablet by mouth once daily 02/23/23   Duanne Limerick, MD  methadone (DOLOPHINE) 1 mg/ml oral solution Take 120 mg/kg by mouth daily.    [provider]  naproxen (NAPROSYN) 500 MG tablet Take 1 tablet (500 mg total) by mouth 2 (two) times daily with a meal. 12/21/22   Katha Cabal, DO    Social History   Socioeconomic History   Marital status: Single    Spouse name: Not on file   Number of children: Not on file   Years of education: Not on file   Highest education level: Not on file  Occupational History   Not on file  Tobacco Use   Smoking status: Every Day    Current packs/day: 0.50    Average packs/day: 0.5 packs/day for 12.0 years (6.0 ttl pk-yrs)    Types: Cigarettes   Smokeless tobacco: Former  Building services engineer status: Never  Used  Substance and Sexual Activity   Alcohol use: Never   Drug use: Yes    Types: Marijuana, Heroin, Oxycodone    Comment: prescribed methadone   Sexual activity: Yes  Other Topics Concern   Not on file  Social History Narrative   Patient is on Methadone for substance abuse. [facility] [place], [place]. Patient Sober 4 years. Living with parents now.    Social Determinants of Health   Financial Resource Strain: Not on file  Food Insecurity: Not on file  Transportation Needs: Not on file  Physical Activity: Not on file  Stress: Not on file  Social Connections: Not on file  Intimate Partner Violence: Not on file     Family History  Problem Relation Age of Onset   Healthy Mother    Healthy Father     ROS: Otherwise negative unless mentioned in HPI  Physical Examination  Vitals:   03/25/23 0918  BP: 115/78  Pulse: 61  Resp: 17  Temp: 98.1 F (36.7 C)  SpO2: 100%   Body mass index is 28.37 kg/m.  General:  WDWN in NAD Gait: Not observed HENT: WNL, normocephalic Pulmonary: normal non-labored breathing, without Rales, rhonchi,  wheezing Cardiac: regular, without  Murmurs, rubs or gallops; without carotid bruits Abdomen: Positive bowel sounds,  soft, NT/ND, no masses Skin: without  rashes Vascular Exam/Pulses: Palpable bilateral lower extremity Pulses. Edema to right lower extremity +1 Extremities: without ischemic changes, without Gangrene , without cellulitis; without open wounds;  Musculoskeletal: no muscle wasting or atrophy  Neurologic: A&O X 3;  No focal weakness or paresthesias are detected; speech is fluent/normal Psychiatric:  The pt has Normal affect. Lymph:  Unremarkable  CBC    Component Value Date/Time   WBC 7.3 03/25/2023 0920   RBC 3.97 (L) 03/25/2023 0920   HGB 11.9 (L) 03/25/2023 0920   HGB 14.9 10/28/2012 2031   HCT 36.5 (L) 03/25/2023 0920   HCT 45.1 10/28/2012 2031   PLT 154 03/25/2023 0920   PLT 176 10/28/2012 2031   MCV 91.9  03/25/2023 0920   MCV 93 10/28/2012 2031   MCH 30.0 03/25/2023 0920   MCHC 32.6 03/25/2023 0920   RDW 14.6 03/25/2023 0920   RDW 14.0 10/28/2012 2031   LYMPHSABS 2.1 03/25/2023 0920   LYMPHSABS 1.6 11/04/2011 1221   MONOABS 0.6 03/25/2023 0920   MONOABS 0.4 11/04/2011 1221   EOSABS 0.2 03/25/2023 0920   EOSABS 0.2 11/04/2011 1221   BASOSABS 0.0 03/25/2023 0920   BASOSABS 0.0 11/04/2011 1221    BMET    Component Value Date/Time   NA 137 03/25/2023 0920   NA 142 01/29/2023 1659   NA 138 10/28/2012 2031   K 4.2 03/25/2023 0920   K 4.0 10/28/2012 2031   CL 104 03/25/2023 0920   CL 104 10/28/2012 2031   CO2 27 03/25/2023 0920   CO2 26 10/28/2012 2031   GLUCOSE 97 03/25/2023 0920   GLUCOSE 89 10/28/2012 2031   BUN 16 03/25/2023 0920   BUN 14 01/29/2023 1659   BUN 14 10/28/2012 2031   CREATININE 0.94 03/25/2023 0920   CREATININE 0.96 01/09/2020 1536   CALCIUM 8.8 (L) 03/25/2023 0920   CALCIUM 9.6 10/28/2012 2031   GFRNONAA >60 03/25/2023 0920   GFRNONAA >60 10/28/2012 2031   GFRAA 115 10/03/2019 1116   GFRAA >60 10/28/2012 2031    COAGS: Lab Results  Component Value Date   INR 1.0 11/08/2019     Non-Invasive Vascular Imaging:   Ultrasound Right Lower Extremity 03/25/23 Positive for Right popliteal DVT   Statin:  No. Beta Blocker:  No. Aspirin:  No. ACEI:  Yes.   ARB:  No. CCB use:  No Other antiplatelets/anticoagulants:  No.    ASSESSMENT/PLAN: This is a 33 y.o. male who presented to Vein and Vascular clinic for swelling of right lower extremity. Patient was noted to have a popliteal clot on ultrasound. Patient told the ultrasound tech he was having difficulty breathing most noted on deep inspiration. Patient was then taken to the Emergency room for further work up.   PLAN: Patient to have CTA chest to rule out possible pulmonary embolisms.  Possible Pulmonary thrombectomy later today if Positive for PE Patient to start eliquis 10 mg BID X 7 days then  convert to 5 mg BID for 6 months if CTA is negative and patient discharged to home from ER.    -I discussed the plan in detail with Dr Festus Barren MD and he agrees with the plan.    Marcie Bal [facility] 03/25/2023 10:13 AM

## 2023-03-25 NOTE — ED Provider Notes (Signed)
[facility] Provider Note    Event Date/Time   First MD Initiated Contact with Patient 03/25/23 [phone]     (approximate)   History   Chief Complaint Leg Pain and Chest Pain  HPI  Christian Doyle is a 32 y.o. male with past medical history of hypertension and chronic pain syndrome who presents to the ED complaining of leg pain and chest pain.  Patient reports that he has been dealing with waxing and waning swelling primarily in his right lower extremity for multiple years.  This has seemed to worsen recently and has been associated with sharp pain in the right side of his chest that is worse when he takes a deep breath or a cough.  Chest pain has been present for 1 week, he denies any fevers or productive cough.  He was seen by vascular surgery earlier today and had an ultrasound that was positive for right popliteal DVT.  He was referred to the ED for further evaluation to rule out pulmonary embolism.     Physical Exam   Triage Vital Signs: ED Triage Vitals  Encounter Vitals Group     BP 03/25/23 0918 115/78     Systolic BP Percentile --      Diastolic BP Percentile --      Pulse Rate 03/25/23 0918 61     Resp 03/25/23 0918 17     Temp 03/25/23 0918 98.1 F (36.7 C)     Temp src --      SpO2 03/25/23 0918 100 %     Weight 03/25/23 0919 215 lb (97.5 kg)     Height 03/25/23 0919 6\' 1"  (1.854 m)     Head Circumference --      Peak Flow --      Pain Score 03/25/23 0918 5     Pain Loc --      Pain Education --      Exclude from Growth Chart --     Most recent vital signs: Vitals:   03/25/23 0918 03/25/23 1010  BP: 115/78 104/64  Pulse: 61 (!) 51  Resp: 17 18  Temp: 98.1 F (36.7 C)   SpO2: 100% 100%    Constitutional: Alert and oriented. Eyes: Conjunctivae are normal. Head: Atraumatic. Nose: No congestion/rhinnorhea. Mouth/Throat: Mucous membranes are moist.  Cardiovascular: Normal rate, regular rhythm. Grossly normal heart sounds.  2+  radial and DP pulses bilaterally. Respiratory: Normal respiratory effort.  No retractions. Lungs CTAB.  No chest wall tenderness to palpation noted. Gastrointestinal: Soft and nontender. No distention. Musculoskeletal: Right lower extremity edema with calf tenderness, no erythema or warmth noted.  No left lower extremity edema or tenderness noted. Neurologic:  Normal speech and language. No gross focal neurologic deficits are appreciated.    ED Results / Procedures / Treatments   Labs (all labs ordered are listed, but only abnormal results are displayed) Labs Reviewed  CBC WITH DIFFERENTIAL/PLATELET - Abnormal; Notable for the following components:      Result Value   RBC 3.97 (*)    Hemoglobin 11.9 (*)    HCT 36.5 (*)    All other components within normal limits  BASIC METABOLIC PANEL - Abnormal; Notable for the following components:   Calcium 8.8 (*)    All other components within normal limits  TROPONIN I (HIGH SENSITIVITY)     EKG  ED ECG REPORT I, Chesley Noon, the attending physician, personally viewed and interpreted this ECG.   Date: 03/25/2023  EKG Time: 9:16  Rate: 61  Rhythm: normal sinus rhythm  Axis: LAD  Intervals:none  ST&T Change: None  RADIOLOGY CTA chest reviewed and interpreted by me with no evidence of pulmonary embolism or focal infiltrate.  PROCEDURES:  Critical Care performed: No  Procedures   MEDICATIONS ORDERED IN ED: Medications  ketorolac (TORADOL) 15 MG/ML injection 15 mg (15 mg Intravenous Given 03/25/23 1009)  iohexol (OMNIPAQUE) 350 MG/ML injection 75 mL (75 mLs Intravenous Contrast Given 03/25/23 1028)     IMPRESSION / MDM / ASSESSMENT AND PLAN / ED COURSE  I reviewed the triage vital signs and the nursing notes.                              32 y.o. male with past medical history of hypertension, chronic pain syndrome, and polysubstance abuse who presents to the ED for 1 week of increasing right lower extremity pain along  with sharp pleuritic pain over the right side of his chest.  Patient's presentation is most consistent with acute presentation with potential threat to life or bodily function.  Differential diagnosis includes, but is not limited to, DVT, PE, ACS, pneumonia, pneumothorax, musculoskeletal pain, GERD, anxiety.  Patient nontoxic-appearing and in no acute distress, vital signs are unremarkable.  EKG shows no evidence of arrhythmia or ischemia, troponin pending but symptoms seem atypical for ACS.  He is high risk for PE given diagnosis of DVT earlier today, will further assess with CTA of his chest.  Additional labs are pending at this time, patient declines pain medication.  CTA chest is negative for PE, labs are reassuring with troponin within normal limits.  No significant anemia, leukocytosis, tract abnormality, or AKI noted.  Patient's pain improved following IV Toradol, doubt ACS given constant atypical symptoms for 1 week.  He is appropriate for discharge home with vascular surgery follow-up, we will start him on Eliquis for DVT.  He was counseled to return to the ED for new or worsening symptoms, patient and mother agree with plan.      FINAL CLINICAL IMPRESSION(S) / ED DIAGNOSES   Final diagnoses:  Acute deep vein thrombosis (DVT) of right peroneal vein (HCC)  Pleuritic chest pain     Rx / DC Orders   ED Discharge Orders          Ordered    APIXABAN (ELIQUIS) VTE STARTER PACK (10MG  AND 5MG )       Note to Pharmacy: If starter pack unavailable, substitute with seventy-four 5 mg apixaban tabs following the above SIG directions.   03/25/23 1142             Note:  This document was prepared using Dragon voice recognition software and may include unintentional dictation errors.   Chesley Noon, MD 03/25/23 1143

## 2023-03-26 ENCOUNTER — Telehealth: Payer: Self-pay

## 2023-03-26 NOTE — Transitions of Care (Post Inpatient/ED Visit) (Signed)
   03/26/2023  Name: Christian Doyle MRN: [id number] DOB: 11-29-1990  Today's TOC FU Call Status: Today's TOC FU Call Status:: Successful TOC FU Call Competed TOC FU Call Complete Date: 03/26/23  Transition Care Management Follow-up Telephone Call Date of Discharge: 03/25/23 Discharge Facility[facility] Type of Discharge: Emergency Department Reason for ED Visit: Other: (Acute deep vein thrombosis (DVT) of right peroneal vein) How have you been since you were released from the hospital?: Better Any questions or concerns?: No  Items Reviewed: Did you receive and understand the discharge instructions provided?: Yes Medications obtained,verified, and reconciled?: Yes (Medications Reviewed) Any new allergies since your discharge?: No Do you have support at home?: Yes  Medications Reviewed Today: Medications Reviewed Today     Reviewed by Merleen Nicely, LPN (Licensed Practical Nurse) on 03/26/23 at 1203  Med List Status: <None>   Medication Order Taking? Sig Documenting Provider Last Dose Status Informant  APIXABAN (ELIQUIS) VTE STARTER PACK (10MG  AND 5MG ) [id number] Yes Take as directed on package: start with two-5mg  tablets twice daily for 7 days. On day 8, switch to one-5mg  tablet twice daily. Chesley Noon, MD Taking Active   lisinopril-hydrochlorothiazide (ZESTORETIC) 20-12.5 MG tablet [id number] Yes Take 1 tablet by mouth once daily Duanne Limerick, MD Taking Active   methadone (DOLOPHINE) 1 mg/ml oral solution [id number] Yes Take 120 mg/kg by mouth daily. [provider] Taking Active             Home Care and Equipment/Supplies: Were Home Health Services Ordered?: NA Any new equipment or medical supplies ordered?: NA  Functional Questionnaire: Do you need assistance with bathing/showering or dressing?: No Do you need assistance with meal preparation?: No Do you need assistance with eating?: No Do you have difficulty  maintaining continence: No Do you need assistance with getting out of bed/getting out of a chair/moving?: No Do you have difficulty managing or taking your medications?: No  Follow up appointments reviewed: PCP Follow-up appointment confirmed?: No MD Provider Line Number:[phone] Given: Yes Specialist Hospital Follow-up appointment confirmed?: No Do you need transportation to your follow-up appointment?: No Do you understand care options if your condition(s) worsen?: Yes-patient verbalized understanding    SIGNATURE  Woodfin Ganja LPN [facility] Nurse Health Advisor Direct Dial [phone]

## 2023-04-02 ENCOUNTER — Ambulatory Visit (INDEPENDENT_AMBULATORY_CARE_PROVIDER_SITE_OTHER): Payer: MEDICAID | Admitting: Vascular Surgery

## 2023-04-02 ENCOUNTER — Telehealth (INDEPENDENT_AMBULATORY_CARE_PROVIDER_SITE_OTHER): Payer: Self-pay | Admitting: Vascular Surgery

## 2023-04-02 ENCOUNTER — Encounter (INDEPENDENT_AMBULATORY_CARE_PROVIDER_SITE_OTHER): Payer: Self-pay

## 2023-04-02 NOTE — Telephone Encounter (Signed)
Spoke with mom and she states understanding and will pass along the message to [facility]

## 2023-04-02 NOTE — Telephone Encounter (Signed)
If he is pain by his ribs and it is bothering him significantly, I would have him present to the ED for evaluation.  Given his recent history of DVT, they should rule out PE

## 2023-04-02 NOTE — Telephone Encounter (Signed)
Patients mom called in stating that she was told by provider if patient is having any pain then to let us know. Patient is having pain by his ribs and its bothering him a lot.   Please call and advise   Patient is at work but you can call his mom and let her know what to do

## 2023-04-24 ENCOUNTER — Other Ambulatory Visit (INDEPENDENT_AMBULATORY_CARE_PROVIDER_SITE_OTHER): Payer: Self-pay | Admitting: Nurse Practitioner

## 2023-04-24 DIAGNOSIS — I82431 Acute embolism and thrombosis of right popliteal vein: Secondary | ICD-10-CM

## 2023-04-29 ENCOUNTER — Other Ambulatory Visit: Payer: Self-pay | Admitting: Family Medicine

## 2023-04-29 NOTE — Telephone Encounter (Signed)
Medication Refill - Medication: APIXABAN (ELIQUIS) VTE STARTER PACK (10MG  AND 5MG ) [[id number]]   Has the patient contacted their pharmacy? Yes.    (Agent: If yes, when and what did the pharmacy advise?) Contact PCP  Preferred Pharmacy (with phone number or street name): [facility] in [place] - [address]   Has the patient been seen for an appointment in the last year OR does the patient have an upcoming appointment? Yes.    Agent: Please be advised that RX refills may take up to 3 business days. We ask that you follow-up with your pharmacy.   Pt is requesting a callback when the medication has been sent. Per pt's mother pt has been trying to get this medication since Thursday and will be out of medication today.

## 2023-04-30 NOTE — Telephone Encounter (Signed)
Requested medication (s) are due for refill today: routing for review  Requested medication (s) are on the active medication list: yes  Last refill:  03/25/23  Future visit scheduled: no  Notes to clinic:  Unable to refill per protocol, last refill by another provider. Routing to PCP for approval.     Requested Prescriptions  Pending Prescriptions Disp Refills   APIXABAN (ELIQUIS) VTE STARTER PACK (10MG  AND 5MG ) 74 each 0    Sig: Take as directed on package: start with two-5mg  tablets twice daily for 7 days. On day 8, switch to one-5mg  tablet twice daily.     Hematology:  Anticoagulants - apixaban Failed - 04/29/2023 10:55 AM      Failed - HGB in normal range and within 360 days    Hemoglobin  Date Value Ref Range Status  03/25/2023 11.9 (L) 13.0 - 17.0 g/dL Final   HGB  Date Value Ref Range Status  10/28/2012 14.9 13.0 - 18.0 g/dL Final         Failed - HCT in normal range and within 360 days    HCT  Date Value Ref Range Status  03/25/2023 36.5 (L) 39.0 - 52.0 % Final  10/28/2012 45.1 40.0 - 52.0 % Final         Passed - PLT in normal range and within 360 days    Platelets  Date Value Ref Range Status  03/25/2023 154 150 - 400 K/uL Final   Platelet  Date Value Ref Range Status  10/28/2012 176 150 - 440 x10 3/mm 3 Final         Passed - Cr in normal range and within 360 days    Creat  Date Value Ref Range Status  01/09/2020 0.96 0.60 - 1.35 mg/dL Final   Creatinine, Ser  Date Value Ref Range Status  03/25/2023 0.94 0.61 - 1.24 mg/dL Final         Passed - AST in normal range and within 360 days    AST  Date Value Ref Range Status  01/29/2023 20 0 - 40 IU/L Final   SGOT(AST)  Date Value Ref Range Status  10/28/2012 32 15 - 37 Unit/L Final         Passed - ALT in normal range and within 360 days    ALT  Date Value Ref Range Status  01/29/2023 19 0 - 44 IU/L Final  11/08/2019 62 (H) 9 - 46 U/L Final         Passed - Valid encounter within last 12  months    Recent Outpatient Visits           3 months ago Venous insufficiency of right leg   [facility]   8 months ago Right leg swelling   [facility]   10 months ago Essential hypertension   [facility]   1 year ago New onset seizure [facility])   [facility]   2 years ago Essential hypertension   [facility]

## 2023-05-01 ENCOUNTER — Ambulatory Visit (INDEPENDENT_AMBULATORY_CARE_PROVIDER_SITE_OTHER): Payer: MEDICAID

## 2023-05-01 ENCOUNTER — Encounter (INDEPENDENT_AMBULATORY_CARE_PROVIDER_SITE_OTHER): Payer: Self-pay | Admitting: Nurse Practitioner

## 2023-05-01 ENCOUNTER — Ambulatory Visit (INDEPENDENT_AMBULATORY_CARE_PROVIDER_SITE_OTHER): Payer: MEDICAID | Admitting: Nurse Practitioner

## 2023-05-01 VITALS — BP 106/65 | HR 64 | Resp 16 | Wt 219.0 lb

## 2023-05-01 DIAGNOSIS — I82431 Acute embolism and thrombosis of right popliteal vein: Secondary | ICD-10-CM | POA: Diagnosis not present

## 2023-05-01 DIAGNOSIS — I10 Essential (primary) hypertension: Secondary | ICD-10-CM

## 2023-05-01 DIAGNOSIS — M25361 Other instability, right knee: Secondary | ICD-10-CM

## 2023-05-01 MED ORDER — APIXABAN 5 MG PO TABS: 5.0000 mg | ORAL_TABLET | Freq: Two times a day (BID) | ORAL | 5 refills | Status: AC

## 2023-05-01 NOTE — Progress Notes (Unsigned)
Subjective:    Patient ID: Christian Doyle, male    DOB: [birth year], 32 y.o.   MRN: [id number] Chief Complaint  Patient presents with  . Follow-up    Ultrasound follow up    HPI  Review of Systems     Objective:   Physical Exam  BP 106/65 (BP Location: Left Arm)   Pulse 64   Resp 16   Wt 219 lb (99.3 kg)   BMI 28.89 kg/m   Past Medical History:  Diagnosis Date  . Drug abuse (HCC)   . Drug addiction in remission (HCC)   . Hypertension     Social History   Socioeconomic History  . Marital status: Single    Spouse name: Not on file  . Number of children: Not on file  . Years of education: Not on file  . Highest education level: Not on file  Occupational History  . Not on file  Tobacco Use  . Smoking status: Every Day    Current packs/day: 0.50    Average packs/day: 0.5 packs/day for 12.0 years (6.0 ttl pk-yrs)    Types: Cigarettes  . Smokeless tobacco: Former  Advertising account planner  . Vaping status: Never Used  Substance and Sexual Activity  . Alcohol use: Never  . Drug use: Yes    Types: Marijuana, Heroin, Oxycodone    Comment: prescribed methadone  . Sexual activity: Yes  Other Topics Concern  . Not on file  Social History Narrative   Patient is on Methadone for substance abuse. [facility] [place], [place]. Patient Sober 4 years. Living with parents now.    Social Determinants of Health   Financial Resource Strain: Not on file  Food Insecurity: Not on file  Transportation Needs: Not on file  Physical Activity: Not on file  Stress: Not on file  Social Connections: Not on file  Intimate Partner Violence: Not on file    Past Surgical History:  Procedure Laterality Date  . TONSILLECTOMY      Family History  Problem Relation Age of Onset  . Healthy Mother   . Healthy Father     No Known Allergies     Latest Ref Rng & Units 03/25/2023    9:20 AM 09/02/2021    4:09 PM 11/08/2019    9:23 AM  CBC  WBC 4.0 - 10.5 K/uL 7.3  10.4  7.7   Hemoglobin  13.0 - 17.0 g/dL 78.2  95.6  21.3   Hematocrit 39.0 - 52.0 % 36.5  39.8  40.4   Platelets 150 - 400 K/uL 154  185  176       CMP     Component Value Date/Time   NA 137 03/25/2023 0920   NA 142 01/29/2023 1659   NA 138 10/28/2012 2031   K 4.2 03/25/2023 0920   K 4.0 10/28/2012 2031   CL 104 03/25/2023 0920   CL 104 10/28/2012 2031   CO2 27 03/25/2023 0920   CO2 26 10/28/2012 2031   GLUCOSE 97 03/25/2023 0920   GLUCOSE 89 10/28/2012 2031   BUN 16 03/25/2023 0920   BUN 14 01/29/2023 1659   BUN 14 10/28/2012 2031   CREATININE 0.94 03/25/2023 0920   CREATININE 0.96 01/09/2020 1536   CALCIUM 8.8 (L) 03/25/2023 0920   CALCIUM 9.6 10/28/2012 2031   PROT 6.9 01/29/2023 1659   PROT 7.9 10/28/2012 2031   ALBUMIN 4.2 01/29/2023 1659   ALBUMIN 4.5 10/28/2012 2031   AST 20 01/29/2023 1659  AST 32 10/28/2012 2031   ALT 19 01/29/2023 1659   ALT 62 (H) 11/08/2019 0923   ALKPHOS 92 01/29/2023 1659   ALKPHOS 63 10/28/2012 2031   BILITOT <0.2 01/29/2023 1659   BILITOT 0.8 10/28/2012 2031   EGFR 112 01/29/2023 1659   GFRNONAA >60 03/25/2023 0920   GFRNONAA >60 10/28/2012 2031     No results found.     Assessment & Plan:   1. Acute deep vein thrombosis (DVT) of popliteal vein of right lower extremity (HCC) Today shows complete resolution of the patient's DVT.  While the patient did have a DVT which account for some of his swelling it is likely not the root cause.  No reflux was noted on his previous exam.  We have discussed the patient undergoing conservative therapy including use of medical grade compression elevation and activity.  I also suspect some of his swelling is related to his ongoing knee issues.  Because his DVT was considered unprovoked, I feel would be in his best interest to continue Eliquis for 6 months.  However in 2 months he can hold it periodically for knee surgery as noted below.  2. Essential hypertension Continue antihypertensive medications as already  ordered, these medications have been reviewed and there are no changes at this time.  3. Instability of right knee joint The patient notes that he has a torn MCL and will likely need surgery on his right knee at some point in time.  He does not have an exact timeframe currently.  Given his history of DVT I have discussed with him that it would be in his best interest to have an IVC filter placed.  We discussed that the IVC filter is typically placed about a week before his knee surgery and we will typically remove it after 6 to 8 weeks after we ensure that he does not have any new DVTs.  We also discussed the timeframe in terms of being able to safely hold his Eliquis.  In this instance the patient would be able to hold his Eliquis prior to surgery after about 3 months from initial DVT diagnosis which would be in about 2 months.   Current Outpatient Medications on File Prior to Visit  Medication Sig Dispense Refill  . lisinopril-hydrochlorothiazide (ZESTORETIC) 20-12.5 MG tablet Take 1 tablet by mouth once daily 90 tablet 0  . methadone (DOLOPHINE) 1 mg/ml oral solution Take 120 mg/kg by mouth daily.     No current facility-administered medications on file prior to visit.    There are no Patient Instructions on file for this visit. No follow-ups on file.   [place]iana Spinner, NP

## 2023-05-26 ENCOUNTER — Telehealth: Payer: Self-pay

## 2023-05-26 NOTE — Transitions of Care (Post Inpatient/ED Visit) (Signed)
05/26/2023  Name: Christian Doyle MRN: [id number] DOB: 1991-07-21  Today's TOC FU Call Status: Today's TOC FU Call Status:: Unsuccessful Call (1st Attempt) Unsuccessful Call (1st Attempt) Date: 05/26/23  Attempted to reach the patient regarding the most recent Inpatient/ED visit.  Follow Up Plan: No further outreach attempts will be made at this time. We have been unable to contact the patient. I spoke with parents of patient- he is unable to speak at this time and unsure of when he will be able to be contacted. No further attempts will be made due to circumstances  Signature Arthur Holms

## 2023-07-10 ENCOUNTER — Telehealth: Payer: Self-pay | Admitting: Family Medicine

## 2023-07-10 ENCOUNTER — Ambulatory Visit: Payer: Medicaid Other | Admitting: Family Medicine

## 2023-07-27 ENCOUNTER — Other Ambulatory Visit (INDEPENDENT_AMBULATORY_CARE_PROVIDER_SITE_OTHER): Payer: Self-pay | Admitting: Nurse Practitioner

## 2023-07-27 DIAGNOSIS — I82431 Acute embolism and thrombosis of right popliteal vein: Secondary | ICD-10-CM

## 2023-08-04 ENCOUNTER — Ambulatory Visit (INDEPENDENT_AMBULATORY_CARE_PROVIDER_SITE_OTHER): Payer: MEDICAID | Admitting: Vascular Surgery

## 2023-08-04 ENCOUNTER — Encounter (INDEPENDENT_AMBULATORY_CARE_PROVIDER_SITE_OTHER): Payer: MEDICAID

## 2024-01-19 ENCOUNTER — Encounter (INDEPENDENT_AMBULATORY_CARE_PROVIDER_SITE_OTHER): Payer: Self-pay
